# Patient Record
Sex: Male | Born: 1995 | Race: Black or African American | Hispanic: No | Marital: Single | Smoking: Current every day smoker
Health system: Southern US, Community
[De-identification: ages and names within clinical notes are randomized; demographics above are authoritative.]

## PROBLEM LIST (undated history)

## (undated) DIAGNOSIS — J45909 Unspecified asthma, uncomplicated: Secondary | ICD-10-CM

## (undated) HISTORY — PX: ICD IMPLANT: EP1208

---
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
## 2017-07-12 ENCOUNTER — Emergency Department (HOSPITAL_BASED_OUTPATIENT_CLINIC_OR_DEPARTMENT_OTHER)
Admission: EM | Admit: 2017-07-12 | Discharge: 2017-07-12 | Disposition: A | Discharge status: Home / Self Care | Payer: Medicaid Other | Attending: Physician Assistant | Admitting: Physician Assistant

## 2017-07-12 ENCOUNTER — Emergency Department (HOSPITAL_BASED_OUTPATIENT_CLINIC_OR_DEPARTMENT_OTHER): Payer: Medicaid Other

## 2017-07-12 ENCOUNTER — Encounter (HOSPITAL_BASED_OUTPATIENT_CLINIC_OR_DEPARTMENT_OTHER): Payer: Self-pay | Admitting: Emergency Medicine

## 2017-07-12 DIAGNOSIS — Y939 Activity, unspecified: Secondary | ICD-10-CM | POA: Diagnosis not present

## 2017-07-12 DIAGNOSIS — M70841 Other soft tissue disorders related to use, overuse and pressure, right hand: Secondary | ICD-10-CM | POA: Diagnosis not present

## 2017-07-12 DIAGNOSIS — J45909 Unspecified asthma, uncomplicated: Secondary | ICD-10-CM | POA: Insufficient documentation

## 2017-07-12 DIAGNOSIS — Z79899 Other long term (current) drug therapy: Secondary | ICD-10-CM | POA: Diagnosis not present

## 2017-07-12 DIAGNOSIS — F17290 Nicotine dependence, other tobacco product, uncomplicated: Secondary | ICD-10-CM | POA: Insufficient documentation

## 2017-07-12 DIAGNOSIS — M779 Enthesopathy, unspecified: Secondary | ICD-10-CM

## 2017-07-12 DIAGNOSIS — M25531 Pain in right wrist: Secondary | ICD-10-CM | POA: Diagnosis present

## 2017-07-12 HISTORY — DX: Unspecified asthma, uncomplicated: J45.909

## 2017-07-12 MED ORDER — IBUPROFEN 800 MG PO TABS: 800.0000 mg | ORAL_TABLET | Freq: Three times a day (TID) | ORAL | 0 refills | Status: DC

## 2017-07-12 NOTE — Discharge Instructions (Signed)
Use ibupofr3en and the wrist splint to help with yoru symptoms.  Return to PCP for follow up as needed.

## 2017-07-12 NOTE — ED Provider Notes (Signed)
[facility]   CSN: [id number] Arrival date & time: 07/12/17  1131     History   Chief Complaint Chief Complaint  Patient presents with  . Hand Pain    HPI Warren Cox is a 21 y.o. male.  HPI   Right wrist pain since starting a new job. 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 Patient reports that it hurts when he uses it. He reports mild swelling to the right wrist. Occasionaltingling in all 5 fingers. Mom has history of tendinitis and carpal tunnel brought him here for evaluation.  No injury. No warmth. No fevers.  Past Medical History:  Diagnosis Date  . Asthma     Patient Active Problem List   Diagnosis Date Noted  . Left shoulder pain 07/01/2016  . Right knee injury 05/23/2016  . Crushing injury of left hand 09/01/2012  . Left wrist injury 09/01/2012  . Knee pain, left 12/06/2011    History reviewed. No pertinent surgical history.     Home Medications    Prior to Admission medications   Medication Sig Start Date End Date Taking? Authorizing Provider  albuterol (PROVENTIL HFA;VENTOLIN HFA) 108 (90 BASE) MCG/ACT inhaler Inhale 2 puffs into the lungs every 6 (six) hours as needed for wheezing or shortness of breath (use before exercise).    [provider]  meloxicam (MOBIC) 15 MG tablet Take 1 tablet (15 mg total) by mouth daily. Take 1 daily with food. 12/23/16   Arthor CaptainHarris, Abigail, PA-C    Family History Family History  Problem Relation Age of Onset  . Sudden death Neg Hx   . Heart attack Neg Hx     Social History Social History  Substance Use Topics  . Smoking status: Current Every Day Smoker    Types: Cigars  . Smokeless tobacco: Never Used  . Alcohol use No     Allergies   Augmentin [amoxicillin-pot clavulanate] and Keflex [cephalexin monohydrate]   Review of Systems Review of Systems  Constitutional: Negative for activity change.  Respiratory: Negative for shortness of breath.   Cardiovascular:  Negative for chest pain.  Gastrointestinal: Negative for abdominal pain.     Physical Exam Updated Vital Signs BP 131/79 (BP Location: Left Arm)   Pulse 70   Temp 98.3 F (36.8 C) (Oral)   Resp 20   Ht 5\' 11"  (1.803 m)   Wt 96.3 kg (212 lb 6.4 oz)   SpO2 100%   BMI 29.62 kg/m   Physical Exam  Constitutional: He is oriented to person, place, and time. He appears well-nourished.  HENT:  Head: Normocephalic.  Eyes: Conjunctivae are normal.  Cardiovascular: Normal rate.   Pulmonary/Chest: Effort normal.  Musculoskeletal:  Full range motion. No warmth or swelling. Negative Phalen's. The strength in all 5 fingers. Normal range of motion in right upper extremity.  Neurological: He is oriented to person, place, and time.  Skin: Skin is warm and dry. He is not diaphoretic.  Psychiatric: He has a normal mood and affect. His behavior is normal.     ED Treatments / Results  Labs (all labs ordered are listed, but only abnormal results are displayed) Labs Reviewed - No data to display  EKG  EKG Interpretation None       Radiology No results found.  Procedures Procedures (including critical care time)  Medications Ordered in ED Medications - No data to display   Initial Impression / Assessment and Plan / ED Course  I have reviewed the  triage vital signs and the nursing notes.  Pertinent labs & imaging results that were available during my care of the patient were reviewed by me and considered in my medical decision making (see chart for details).     Right wrist pain since starting a new job. New jobDatabase administratoring boxes. Patient reports that it hurts when he uses it. He reports mild swelling to the right wrist. Occasionaltingling in all 5 fingers. Mom has history of tendinitis and carpal tunnel brought him here for evaluation.  No injury. No warmth. No fevers.  11:55 AM We'll get x-ray. Likely due to overuse injury. We'll have him use wrist splint,  ibuprofen For the next several days to follow up with primary care physician.  Final Clinical Impressions(s) / ED Diagnoses   Final diagnoses:  None    New Prescriptions New Prescriptions   No medications on file     Abelino Derrick, MD 07/12/17 1155

## 2017-07-12 NOTE — ED Triage Notes (Signed)
Right hand with swelling, tingling and pain. Denies injury but has started a new job where he is using his hands in a certain motion. Used Ibuprofen, Ice and Heat. No relief.

## 2019-08-11 ENCOUNTER — Other Ambulatory Visit: Payer: Self-pay

## 2019-08-11 ENCOUNTER — Encounter (HOSPITAL_BASED_OUTPATIENT_CLINIC_OR_DEPARTMENT_OTHER): Payer: Self-pay

## 2019-08-11 ENCOUNTER — Emergency Department (HOSPITAL_BASED_OUTPATIENT_CLINIC_OR_DEPARTMENT_OTHER): Payer: Self-pay

## 2019-08-11 ENCOUNTER — Emergency Department (HOSPITAL_BASED_OUTPATIENT_CLINIC_OR_DEPARTMENT_OTHER)
Admission: EM | Admit: 2019-08-11 | Discharge: 2019-08-11 | Disposition: A | Discharge status: Home / Self Care | Payer: Self-pay | Attending: Emergency Medicine | Admitting: Emergency Medicine

## 2019-08-11 DIAGNOSIS — J45909 Unspecified asthma, uncomplicated: Secondary | ICD-10-CM | POA: Insufficient documentation

## 2019-08-11 DIAGNOSIS — F17290 Nicotine dependence, other tobacco product, uncomplicated: Secondary | ICD-10-CM | POA: Insufficient documentation

## 2019-08-11 DIAGNOSIS — M25561 Pain in right knee: Secondary | ICD-10-CM | POA: Insufficient documentation

## 2019-08-11 MED ORDER — NAPROXEN 500 MG PO TABS: 500.0000 mg | ORAL_TABLET | Freq: Two times a day (BID) | ORAL | 0 refills | Status: AC | PRN

## 2019-08-11 MED ORDER — NAPROXEN 250 MG PO TABS
500.0000 mg | ORAL_TABLET | Freq: Once | ORAL | Status: AC
  Administered 2019-08-11: 20:00:00 500 mg via ORAL
  Filled 2019-08-11: qty 2

## 2019-08-11 NOTE — ED Provider Notes (Signed)
[facility] EMERGENCY DEPARTMENT Provider Note   CSN: [id number] Arrival date & time: 08/11/19  1857     History   Chief Complaint Chief Complaint  Patient presents with  . Knee Injury    HPI Warren Cox is a 23 y.o. male with past medical history significant for asthma presents to emergency department today with chief complaint of right knee pain.  Patient states he has a history of injury to right knee back in high school when he played basketball.  He reports that 2 weeks ago he twisted wrong and felt pain in the right knee.  It resolved without intervention.  Unfortunately tonight while he was at work he states he again twisted his right knee and is having 7 out of 10 pain.  He describes the pain as dull ache with intermittent stabbing sensation only present with movement.  He is able to walk and bear weight on his right lower extremity.  He has been taking Tylenol with minimal symptom relief.  He denies any numbness, weakness or tingling sensation.  Denies hip pain or ankle pain. History provided by patient with additional history obtained from chart review.       Past Medical History:  Diagnosis Date  . Asthma     Patient Active Problem List   Diagnosis Date Noted  . Left shoulder pain 07/01/2016  . Right knee injury 05/23/2016  . Crushing injury of left hand 09/01/2012  . Left wrist injury 09/01/2012  . Knee pain, left 12/06/2011    History reviewed. No pertinent surgical history.      Home Medications    Prior to Admission medications   Medication Sig Start Date End Date Taking? Authorizing Provider  albuterol (PROVENTIL HFA;VENTOLIN HFA) 108 (90 BASE) MCG/ACT inhaler Inhale 2 puffs into the lungs every 6 (six) hours as needed for wheezing or shortness of breath (use before exercise).    [provider]  naproxen (NAPROSYN) 500 MG tablet Take 1 tablet (500 mg total) by mouth 2 (two) times daily as needed for up to 14 days. 08/11/19 08/25/19   Kenlie Seki, Caroleen Hamman, PA-C    Family History Family History  Problem Relation Age of Onset  . Sudden death Neg Hx   . Heart attack Neg Hx     Social History Social History   Tobacco Use  . Smoking status: Current Every Day Smoker    Types: Cigars  . Smokeless tobacco: Never Used  Substance Use Topics  . Alcohol use: Yes    Alcohol/week: 0.0 standard drinks    Frequency: Never    Comment: occ  . Drug use: No     Allergies   Augmentin [amoxicillin-pot clavulanate] and Keflex [cephalexin monohydrate]   Review of Systems Review of Systems  Constitutional: Negative for chills and fever.  Gastrointestinal: Negative for nausea and vomiting.  Musculoskeletal: Positive for arthralgias. Negative for back pain, gait problem, joint swelling, myalgias and neck pain.  Skin: Negative for rash and wound.  Neurological: Negative for weakness and numbness.     Physical Exam Updated Vital Signs BP 120/69 (BP Location: Left Arm)   Pulse 67   Temp 98.2 F (36.8 C) (Oral)   Resp 18   Ht 5\' 11"  (1.803 m)   Wt 89.4 kg   SpO2 99%   BMI 27.48 kg/m   Physical Exam Vitals signs and nursing note reviewed.  Constitutional:      Appearance: He is well-developed. He is not ill-appearing or toxic-appearing.  HENT:  Head: Normocephalic and atraumatic.     Nose: Nose normal.  Eyes:     General: No scleral icterus.       Right eye: No discharge.        Left eye: No discharge.     Conjunctiva/sclera: Conjunctivae normal.  Neck:     Musculoskeletal: Normal range of motion.     Vascular: No JVD.  Cardiovascular:     Rate and Rhythm: Normal rate and regular rhythm.     Pulses: Normal pulses.          Dorsalis pedis pulses are 2+ on the right side and 2+ on the left side.     Heart sounds: Normal heart sounds.  Pulmonary:     Effort: Pulmonary effort is normal.     Breath sounds: Normal breath sounds.  Abdominal:     General: There is no distension.  Musculoskeletal: Normal  range of motion.     Right hip: Normal.     Right knee: He exhibits swelling, bony tenderness and MCL laxity. He exhibits normal range of motion, no effusion, no ecchymosis, no deformity, no laceration, no erythema, normal alignment, no LCL laxity, normal patellar mobility and normal meniscus. No tenderness found. No medial joint line and no lateral joint line tenderness noted.     Right ankle: Normal.     Right lower leg: No edema.     Left lower leg: No edema.     Comments: Pt ambulates with steady gait.  Negative anterior and posterior drawer test to right knee.Neurovascularly intact distally. Compartments soft above and below affected joint.    Skin:    General: Skin is warm and dry.  Neurological:     Mental Status: He is oriented to person, place, and time.     GCS: GCS eye subscore is 4. GCS verbal subscore is 5. GCS motor subscore is 6.     Comments: Fluent speech, no facial droop.  Psychiatric:        Behavior: Behavior normal.      ED Treatments / Results  Labs (all labs ordered are listed, but only abnormal results are displayed) Labs Reviewed - No data to display  EKG None  Radiology Dg Knee Complete 4 Views Right  Result Date: 08/11/2019 CLINICAL DATA:  Recent injury with right knee pain, initial encounter EXAM: RIGHT KNEE - COMPLETE 4+ VIEW COMPARISON:  None. FINDINGS: No evidence of fracture, dislocation, or joint effusion. No evidence of arthropathy or other focal bone abnormality. Soft tissues are unremarkable. IMPRESSION: No acute abnormality noted. Electronically Signed   By: Inez Catalina M.D.   On: 08/11/2019 19:37    Procedures Procedures (including critical care time)  Medications Ordered in ED Medications  naproxen (NAPROSYN) tablet 500 mg (has no administration in time range)     Initial Impression / Assessment and Plan / ED Course  I have reviewed the triage vital signs and the nursing notes.  Pertinent labs & imaging results that were available  during my care of the patient were reviewed by me and considered in my medical decision making (see chart for details).  Patient presents to the ED with complaints of pain to the right knee pain s/p twisting it wrong at work.. Exam without obvious deformity or open wounds. ROM intact.  Bony tenderness to palpation.  NVI distally. Xray negative for fracture/dislocation. Therapeutic splint provided.  He is able to bear weight on right leg and ambulates with steady gait.Marland Kitchen PRICE and naproxen recommended. I  discussed results, treatment plan, need for follow-up with sports medicine, and return precautions with the patient. Provided opportunity for questions, patient confirmed understanding and are in agreement with plan.   Portions of this note were generated with Scientist, clinical (histocompatibility and immunogenetics)Dragon dictation software. Dictation errors may occur despite best attempts at proofreading.   Final Clinical Impressions(s) / ED Diagnoses   Final diagnoses:  Acute pain of right knee    ED Discharge Orders         Ordered    naproxen (NAPROSYN) 500 MG tablet  2 times daily PRN     08/11/19 2008           Kathyrn Lasslbrizze, Chimaobi Casebolt E, PA-C 08/11/19 2021    Tegeler, Canary Brimhristopher J, MD 08/11/19 [phone]

## 2019-08-11 NOTE — Discharge Instructions (Addendum)
You were seen today for pain in your right knee. Please read and follow all provided instructions.   The x-ray of your knee today did not show signs of fracture or dislocation.  1. Medications: alternate naprosyn and tylenol for pain control, usual home medications.  You should eat when taking Naprosyn as it can cause her stomach to become upset  2. Treatment: rest, ice, elevate and use brace, drink plenty of fluids, gentle stretching  3. Follow Up: Please followup with the sports medicine Dr. Raeford Razor as listed above.  Call his office tomorrow to schedule follow-up appointment.  Please return to the ER for worsening symptoms or other concerns

## 2019-08-11 NOTE — ED Triage Notes (Signed)
Pt c/o injury to right knee x 2 in the last 1-2 weeks-NAD-steady gait

## 2019-09-29 ENCOUNTER — Emergency Department (HOSPITAL_BASED_OUTPATIENT_CLINIC_OR_DEPARTMENT_OTHER): Payer: Self-pay

## 2019-09-29 ENCOUNTER — Emergency Department (HOSPITAL_BASED_OUTPATIENT_CLINIC_OR_DEPARTMENT_OTHER)
Admission: EM | Admit: 2019-09-29 | Discharge: 2019-09-29 | Disposition: A | Discharge status: Home / Self Care | Payer: Self-pay | Attending: Emergency Medicine | Admitting: Emergency Medicine

## 2019-09-29 ENCOUNTER — Encounter (HOSPITAL_BASED_OUTPATIENT_CLINIC_OR_DEPARTMENT_OTHER): Payer: Self-pay

## 2019-09-29 ENCOUNTER — Other Ambulatory Visit: Payer: Self-pay

## 2019-09-29 DIAGNOSIS — S63502A Unspecified sprain of left wrist, initial encounter: Secondary | ICD-10-CM | POA: Insufficient documentation

## 2019-09-29 DIAGNOSIS — Y929 Unspecified place or not applicable: Secondary | ICD-10-CM | POA: Insufficient documentation

## 2019-09-29 DIAGNOSIS — X500XXA Overexertion from strenuous movement or load, initial encounter: Secondary | ICD-10-CM | POA: Insufficient documentation

## 2019-09-29 DIAGNOSIS — Y9389 Activity, other specified: Secondary | ICD-10-CM | POA: Insufficient documentation

## 2019-09-29 DIAGNOSIS — Z79899 Other long term (current) drug therapy: Secondary | ICD-10-CM | POA: Insufficient documentation

## 2019-09-29 DIAGNOSIS — F17290 Nicotine dependence, other tobacco product, uncomplicated: Secondary | ICD-10-CM | POA: Insufficient documentation

## 2019-09-29 DIAGNOSIS — Y999 Unspecified external cause status: Secondary | ICD-10-CM | POA: Insufficient documentation

## 2019-09-29 MED ORDER — IBUPROFEN 600 MG PO TABS: 600.0000 mg | ORAL_TABLET | Freq: Four times a day (QID) | ORAL | 0 refills | Status: AC | PRN

## 2019-09-29 MED ORDER — ACETAMINOPHEN 500 MG PO TABS: 1000.0000 mg | ORAL_TABLET | Freq: Three times a day (TID) | ORAL | 0 refills | Status: AC | PRN

## 2019-09-29 MED ORDER — IBUPROFEN 200 MG PO TABS
600.0000 mg | ORAL_TABLET | Freq: Once | ORAL | Status: AC
  Administered 2019-09-29: 11:00:00 600 mg via ORAL
  Filled 2019-09-29: qty 1

## 2019-09-29 NOTE — ED Provider Notes (Signed)
[facility] EMERGENCY DEPARTMENT Provider Note   CSN: [id number] Arrival date & time: 09/29/19  1015     History   Chief Complaint Chief Complaint  Patient presents with  . Wrist Pain    HPI Warren Cox is a 23 y.o. male with history of asthma who presents with left wrist pain after lifting a couch.  Patient reports she was helping a friend move and he suddenly felt pain in his left wrist.  He did not feel anything pop.  He denies any numbness or tingling.  He has had decreased range of motion due to pain since.  He has been using Tylenol, ice, heat without relief.  He denies any other injuries.     HPI  Past Medical History:  Diagnosis Date  . Asthma     Patient Active Problem List   Diagnosis Date Noted  . Left shoulder pain 07/01/2016  . Right knee injury 05/23/2016  . Crushing injury of left hand 09/01/2012  . Left wrist injury 09/01/2012  . Knee pain, left 12/06/2011    History reviewed. No pertinent surgical history.      Home Medications    Prior to Admission medications   Medication Sig Start Date End Date Taking? Authorizing Provider  acetaminophen (TYLENOL) 500 MG tablet Take 2 tablets (1,000 mg total) by mouth every 8 (eight) hours as needed for moderate pain. 09/29/19   Meeyah Ovitt, Waylan Boga, PA-C  albuterol (PROVENTIL HFA;VENTOLIN HFA) 108 (90 BASE) MCG/ACT inhaler Inhale 2 puffs into the lungs every 6 (six) hours as needed for wheezing or shortness of breath (use before exercise).    [provider]  ibuprofen (ADVIL) 600 MG tablet Take 1 tablet (600 mg total) by mouth every 6 (six) hours as needed. 09/29/19   Emi Holes, PA-C    Family History Family History  Problem Relation Age of Onset  . Sudden death Neg Hx   . Heart attack Neg Hx     Social History Social History   Tobacco Use  . Smoking status: Current Every Day Smoker    Types: Cigars  . Smokeless tobacco: Never Used  Substance Use Topics  . Alcohol use:  Yes    Alcohol/week: 0.0 standard drinks    Frequency: Never    Comment: occ  . Drug use: No     Allergies   Augmentin [amoxicillin-pot clavulanate] and Keflex [cephalexin monohydrate]   Review of Systems Review of Systems  Musculoskeletal: Positive for arthralgias and joint swelling.  Neurological: Negative for numbness.     Physical Exam Updated Vital Signs BP (!) 143/73 (BP Location: Right Arm)   Pulse 68   Temp 98.5 F (36.9 C) (Oral)   Resp 18   Ht 5\' 11"  (1.803 m)   Wt 88.5 kg   SpO2 100%   BMI 27.20 kg/m   Physical Exam Vitals signs and nursing note reviewed.  Constitutional:      General: He is not in acute distress.    Appearance: He is well-developed. He is not diaphoretic.  HENT:     Head: Normocephalic and atraumatic.     Mouth/Throat:     Pharynx: No oropharyngeal exudate.  Eyes:     General: No scleral icterus.       Right eye: No discharge.        Left eye: No discharge.     Conjunctiva/sclera: Conjunctivae normal.     Pupils: Pupils are equal, round, and reactive to light.  Neck:  Musculoskeletal: Normal range of motion and neck supple.     Thyroid: No thyromegaly.  Cardiovascular:     Rate and Rhythm: Normal rate and regular rhythm.     Heart sounds: Normal heart sounds. No murmur. No friction rub. No gallop.   Pulmonary:     Effort: Pulmonary effort is normal. No respiratory distress.     Breath sounds: Normal breath sounds. No stridor. No wheezing or rales.  Musculoskeletal:     Comments: Left wrist tenderness on the radial aspect extending over the carpal bones; no anatomical snuffbox tenderness; pain was significant with wrist extension and deviation; radial pulses intact, sensation intact; mild swelling noted, no deformity  Lymphadenopathy:     Cervical: No cervical adenopathy.  Skin:    General: Skin is warm and dry.     Coloration: Skin is not pale.     Findings: No rash.  Neurological:     Mental Status: He is alert.      Coordination: Coordination normal.      ED Treatments / Results  Labs (all labs ordered are listed, but only abnormal results are displayed) Labs Reviewed - No data to display  EKG None  Radiology Dg Wrist Complete Left  Result Date: 09/29/2019 CLINICAL DATA:  Left wrist pain after lifting couch EXAM: LEFT WRIST - COMPLETE 3+ VIEW COMPARISON:  None. FINDINGS: Alignment is anatomic. There is no acute fracture. Joint spaces are preserved. No intrinsic osseous lesion. IMPRESSION: No acute fracture or malalignment. Electronically Signed   By: Macy Mis M.D.   On: 09/29/2019 10:41    Procedures Procedures (including critical care time)  Medications Ordered in ED Medications  ibuprofen (ADVIL) tablet 600 mg (600 mg Oral Given 09/29/19 1035)     Initial Impression / Assessment and Plan / ED Course  I have reviewed the triage vital signs and the nursing notes.  Pertinent labs & imaging results that were available during my care of the patient were reviewed by me and considered in my medical decision making (see chart for details).        Patient with left wrist pain after lifting a couch.  X-ray is negative.  There is no anatomical snuffbox tenderness.  Suspect sprain.  Supportive treatment discussed including rest, ice, brace, NSAIDs, Tylenol.  Follow-up to sports medicine if not improving.  Return precautions discussed.  Patient understands and agrees with plan.  Patient vitals stable and discharged in satisfactory condition.  Final Clinical Impressions(s) / ED Diagnoses   Final diagnoses:  Sprain of left wrist, initial encounter    ED Discharge Orders         Ordered    ibuprofen (ADVIL) 600 MG tablet  Every 6 hours PRN     09/29/19 1054    acetaminophen (TYLENOL) 500 MG tablet  Every 8 hours PRN     09/29/19 1054           Frederica Kuster, [place] 09/29/19 1057    Hayden Rasmussen, MD 09/29/19 1821

## 2019-09-29 NOTE — ED Triage Notes (Signed)
Pt reports that he was helping his friend move yesterday and developed instant pain to his right wrist. Pt is guarding in triage. Pt took tylenol around midnight.

## 2019-09-29 NOTE — ED Notes (Signed)
ED Provider at bedside. 

## 2019-09-29 NOTE — Discharge Instructions (Signed)
I suspect you sprained your wrist.  Use ice 3-4 times daily alternating 20 minutes on, 20 minutes off.  Wear the splint at all times except when bathing, especially when you are using it.  Take ibuprofen and Tylenol as prescribed for your pain.  Please follow-up with Dr. Raeford Razor with sports medicine if symptoms are not getting better.  Please return to the emergency department if you develop any new or worsening symptoms

## 2019-12-17 ENCOUNTER — Encounter (HOSPITAL_BASED_OUTPATIENT_CLINIC_OR_DEPARTMENT_OTHER): Payer: Self-pay

## 2019-12-17 ENCOUNTER — Emergency Department (HOSPITAL_BASED_OUTPATIENT_CLINIC_OR_DEPARTMENT_OTHER): Payer: Self-pay

## 2019-12-17 ENCOUNTER — Emergency Department (HOSPITAL_BASED_OUTPATIENT_CLINIC_OR_DEPARTMENT_OTHER)
Admission: EM | Admit: 2019-12-17 | Discharge: 2019-12-18 | Disposition: A | Discharge status: Home / Self Care | Payer: Self-pay | Attending: Emergency Medicine | Admitting: Emergency Medicine

## 2019-12-17 ENCOUNTER — Other Ambulatory Visit: Payer: Self-pay

## 2019-12-17 DIAGNOSIS — R0789 Other chest pain: Secondary | ICD-10-CM | POA: Insufficient documentation

## 2019-12-17 DIAGNOSIS — Z880 Allergy status to penicillin: Secondary | ICD-10-CM | POA: Insufficient documentation

## 2019-12-17 DIAGNOSIS — R509 Fever, unspecified: Secondary | ICD-10-CM

## 2019-12-17 DIAGNOSIS — Z881 Allergy status to other antibiotic agents status: Secondary | ICD-10-CM | POA: Insufficient documentation

## 2019-12-17 DIAGNOSIS — J45909 Unspecified asthma, uncomplicated: Secondary | ICD-10-CM | POA: Insufficient documentation

## 2019-12-17 DIAGNOSIS — R0602 Shortness of breath: Secondary | ICD-10-CM | POA: Insufficient documentation

## 2019-12-17 DIAGNOSIS — N3000 Acute cystitis without hematuria: Secondary | ICD-10-CM | POA: Insufficient documentation

## 2019-12-17 DIAGNOSIS — Z79899 Other long term (current) drug therapy: Secondary | ICD-10-CM | POA: Insufficient documentation

## 2019-12-17 DIAGNOSIS — N390 Urinary tract infection, site not specified: Secondary | ICD-10-CM

## 2019-12-17 DIAGNOSIS — D72829 Elevated white blood cell count, unspecified: Secondary | ICD-10-CM | POA: Insufficient documentation

## 2019-12-17 DIAGNOSIS — Z20822 Contact with and (suspected) exposure to covid-19: Secondary | ICD-10-CM | POA: Insufficient documentation

## 2019-12-17 DIAGNOSIS — F17210 Nicotine dependence, cigarettes, uncomplicated: Secondary | ICD-10-CM | POA: Insufficient documentation

## 2019-12-17 LAB — URINALYSIS, ROUTINE W REFLEX MICROSCOPIC
Bilirubin Urine: NEGATIVE
Glucose, UA: NEGATIVE mg/dL
Hgb urine dipstick: NEGATIVE
Ketones, ur: 15 mg/dL — AB
Nitrite: POSITIVE — AB
Protein, ur: NEGATIVE mg/dL
Specific Gravity, Urine: 1.01 (ref 1.005–1.030)
pH: 6 (ref 5.0–8.0)

## 2019-12-17 LAB — COMPREHENSIVE METABOLIC PANEL
ALT: 15 U/L (ref 0–44)
AST: 20 U/L (ref 15–41)
Albumin: 4.6 g/dL (ref 3.5–5.0)
Alkaline Phosphatase: 36 U/L — ABNORMAL LOW (ref 38–126)
Anion gap: 11 (ref 5–15)
BUN: 10 mg/dL (ref 6–20)
CO2: 23 mmol/L (ref 22–32)
Calcium: 9.4 mg/dL (ref 8.9–10.3)
Chloride: 100 mmol/L (ref 98–111)
Creatinine, Ser: 1.02 mg/dL (ref 0.61–1.24)
GFR calc Af Amer: >60 mL/min (ref >60)
GFR calc non Af Amer: >60 mL/min (ref >60)
Glucose, Bld: 95 mg/dL (ref 70–99)
Potassium: 3.1 mmol/L — ABNORMAL LOW (ref 3.5–5.1)
Sodium: 134 mmol/L — ABNORMAL LOW (ref 135–145)
Total Bilirubin: 2.1 mg/dL — ABNORMAL HIGH (ref 0.3–1.2)
Total Protein: 8.4 g/dL — ABNORMAL HIGH (ref 6.5–8.1)

## 2019-12-17 LAB — CBC WITH DIFFERENTIAL/PLATELET
Abs Immature Granulocytes: 0.09 10*3/uL — ABNORMAL HIGH (ref 0.00–0.07)
Basophils Absolute: 0 10*3/uL (ref 0.0–0.1)
Basophils Relative: 0 %
Eosinophils Absolute: 0 10*3/uL (ref 0.0–0.5)
Eosinophils Relative: 0 %
HCT: 42.2 % (ref 39.0–52.0)
Hemoglobin: 14.1 g/dL (ref 13.0–17.0)
Immature Granulocytes: 1 %
Lymphocytes Relative: 13 %
Lymphs Abs: 2.5 10*3/uL (ref 0.7–4.0)
MCH: 29.2 pg (ref 26.0–34.0)
MCHC: 33.4 g/dL (ref 30.0–36.0)
MCV: 87.4 fL (ref 80.0–100.0)
Monocytes Absolute: 1.4 10*3/uL — ABNORMAL HIGH (ref 0.1–1.0)
Monocytes Relative: 7 %
Neutro Abs: 15 10*3/uL — ABNORMAL HIGH (ref 1.7–7.7)
Neutrophils Relative %: 79 %
Platelets: 272 10*3/uL (ref 150–400)
RBC: 4.83 MIL/uL (ref 4.22–5.81)
RDW: 12.2 % (ref 11.5–15.5)
WBC: 19 10*3/uL — ABNORMAL HIGH (ref 4.0–10.5)
nRBC: 0 % (ref 0.0–0.2)

## 2019-12-17 LAB — GROUP A STREP BY PCR: Group A Strep by PCR: NOT DETECTED

## 2019-12-17 LAB — URINALYSIS, MICROSCOPIC (REFLEX): Squamous Epithelial / HPF: NONE SEEN (ref 0–5)

## 2019-12-17 LAB — SARS CORONAVIRUS 2 AG (30 MIN TAT): SARS Coronavirus 2 Ag: NEGATIVE

## 2019-12-17 LAB — LACTIC ACID, PLASMA: Lactic Acid, Venous: 0.7 mmol/L (ref 0.5–1.9)

## 2019-12-17 MED ORDER — ACETAMINOPHEN 325 MG PO TABS
650.0000 mg | ORAL_TABLET | Freq: Once | ORAL | Status: AC | PRN
  Administered 2019-12-17: 650 mg via ORAL
  Filled 2019-12-17: qty 2

## 2019-12-17 MED ORDER — IOHEXOL 300 MG/ML  SOLN
100.0000 mL | Freq: Once | INTRAMUSCULAR | Status: AC | PRN
  Administered 2019-12-17: 100 mL via INTRAVENOUS

## 2019-12-17 MED ORDER — SODIUM CHLORIDE 0.9 % IV BOLUS
1000.0000 mL | Freq: Once | INTRAVENOUS | Status: AC
  Administered 2019-12-17: 22:00:00 1000 mL via INTRAVENOUS

## 2019-12-17 MED ORDER — AZITHROMYCIN 1 G PO PACK
2.0000 g | PACK | Freq: Once | ORAL | Status: AC
  Administered 2019-12-18: 2 g via ORAL
  Filled 2019-12-17: qty 2

## 2019-12-17 MED ORDER — CIPROFLOXACIN HCL 500 MG PO TABS
500.0000 mg | ORAL_TABLET | Freq: Once | ORAL | Status: AC
  Administered 2019-12-18: 500 mg via ORAL
  Filled 2019-12-17: qty 1

## 2019-12-17 MED ORDER — CIPROFLOXACIN HCL 500 MG PO TABS: 500.0000 mg | ORAL_TABLET | Freq: Two times a day (BID) | ORAL | 0 refills | Status: AC

## 2019-12-17 NOTE — ED Triage Notes (Signed)
Pt c/o fever, n/v, SOB started last night-last tylenol "this morning"-NAD-steady gait

## 2019-12-17 NOTE — ED Provider Notes (Addendum)
Nursing notes and vitals signs, including pulse oximetry, reviewed.  Summary of this visit's results, reviewed by myself:  EKG:  EKG Interpretation  Date/Time:    Ventricular Rate:    PR Interval:    QRS Duration:   QT Interval:    QTC Calculation:   R Axis:     Text Interpretation:         Labs:  Results for orders placed or performed during the hospital encounter of 12/17/19 (from the past 24 hour(s))  Comprehensive metabolic panel     Status: Abnormal   Collection Time: 12/17/19  7:49 PM  Result Value Ref Range   Sodium 134 (L) 135 - 145 mmol/L   Potassium 3.1 (L) 3.5 - 5.1 mmol/L   Chloride 100 98 - 111 mmol/L   CO2 23 22 - 32 mmol/L   Glucose, Bld 95 70 - 99 mg/dL   BUN 10 6 - 20 mg/dL   Creatinine, Ser 1.02 0.61 - 1.24 mg/dL   Calcium 9.4 8.9 - 10.3 mg/dL   Total Protein 8.4 (H) 6.5 - 8.1 g/dL   Albumin 4.6 3.5 - 5.0 g/dL   AST 20 15 - 41 U/L   ALT 15 0 - 44 U/L   Alkaline Phosphatase 36 (L) 38 - 126 U/L   Total Bilirubin 2.1 (H) 0.3 - 1.2 mg/dL   GFR calc non Af Amer >60 >60 mL/min   GFR calc Af Amer >60 >60 mL/min   Anion gap 11 5 - 15  CBC with Differential     Status: Abnormal   Collection Time: 12/17/19  7:49 PM  Result Value Ref Range   WBC 19.0 (H) 4.0 - 10.5 K/uL   RBC 4.83 4.22 - 5.81 MIL/uL   Hemoglobin 14.1 13.0 - 17.0 g/dL   HCT 42.2 39.0 - 52.0 %   MCV 87.4 80.0 - 100.0 fL   MCH 29.2 26.0 - 34.0 pg   MCHC 33.4 30.0 - 36.0 g/dL   RDW 12.2 11.5 - 15.5 %   Platelets 272 150 - 400 K/uL   nRBC 0.0 0.0 - 0.2 %   Neutrophils Relative % 79 %   Neutro Abs 15.0 (H) 1.7 - 7.7 K/uL   Lymphocytes Relative 13 %   Lymphs Abs 2.5 0.7 - 4.0 K/uL   Monocytes Relative 7 %   Monocytes Absolute 1.4 (H) 0.1 - 1.0 K/uL   Eosinophils Relative 0 %   Eosinophils Absolute 0.0 0.0 - 0.5 K/uL   Basophils Relative 0 %   Basophils Absolute 0.0 0.0 - 0.1 K/uL   Immature Granulocytes 1 %   Abs Immature Granulocytes 0.09 (H) 0.00 - 0.07 K/uL  SARS Coronavirus 2 Ag  (30 min TAT) - Nasal Swab (BD Veritor Kit)     Status: None   Collection Time: 12/17/19  8:52 PM   Specimen: Nasal Swab (BD Veritor Kit)  Result Value Ref Range   SARS Coronavirus 2 Ag NEGATIVE NEGATIVE  Group A Strep by PCR     Status: None   Collection Time: 12/17/19  9:30 PM   Specimen: Throat; Sterile Swab  Result Value Ref Range   Group A Strep by PCR NOT DETECTED NOT DETECTED  Lactic acid, plasma     Status: None   Collection Time: 12/17/19  9:50 PM  Result Value Ref Range   Lactic Acid, Venous 0.7 0.5 - 1.9 mmol/L  Urinalysis, Routine w reflex microscopic     Status: Abnormal   Collection Time: 12/17/19 10:30  PM  Result Value Ref Range   Color, Urine YELLOW YELLOW   APPearance HAZY (A) CLEAR   Specific Gravity, Urine 1.010 1.005 - 1.030   pH 6.0 5.0 - 8.0   Glucose, UA NEGATIVE NEGATIVE mg/dL   Hgb urine dipstick NEGATIVE NEGATIVE   Bilirubin Urine NEGATIVE NEGATIVE   Ketones, ur 15 (A) NEGATIVE mg/dL   Protein, ur NEGATIVE NEGATIVE mg/dL   Nitrite POSITIVE (A) NEGATIVE   Leukocytes,Ua SMALL (A) NEGATIVE  Rapid urine drug screen (hospital performed)     Status: Abnormal   Collection Time: 12/17/19 10:30 PM  Result Value Ref Range   Opiates NONE DETECTED NONE DETECTED   Cocaine NONE DETECTED NONE DETECTED   Benzodiazepines NONE DETECTED NONE DETECTED   Amphetamines NONE DETECTED NONE DETECTED   Tetrahydrocannabinol POSITIVE (A) NONE DETECTED   Barbiturates NONE DETECTED NONE DETECTED  Urinalysis, Microscopic (reflex)     Status: Abnormal   Collection Time: 12/17/19 10:30 PM  Result Value Ref Range   RBC / HPF 0-5 0 - 5 RBC/hpf   WBC, UA 21-50 0 - 5 WBC/hpf   Bacteria, UA MANY (A) NONE SEEN   Squamous Epithelial / LPF NONE SEEN 0 - 5    Imaging Studies: CT Chest W Contrast  Result Date: 12/17/2019 CLINICAL DATA:  Abdominal pain, fever. Patient reports rib pain. Shortness of breath. EXAM: CT CHEST, ABDOMEN, AND PELVIS WITH CONTRAST TECHNIQUE: Multidetector CT  imaging of the chest, abdomen and pelvis was performed following the standard protocol during bolus administration of intravenous contrast. CONTRAST:  11m OMNIPAQUE IOHEXOL 300 MG/ML  SOLN COMPARISON:  Chest radiograph earlier this day. FINDINGS: CT CHEST FINDINGS Cardiovascular: Thoracic aorta is normal in caliber. Examination not tailored for pulmonary artery evaluation, no gross central pulmonary embolus. Heart is normal in size. No pericardial effusion. Mediastinum/Nodes: No enlarged mediastinal or hilar lymph nodes. Minimal triangular soft tissue density in the anterior mediastinum most consistent with residual thymus, normal for age. No thyroid nodule. Unremarkable esophagus. Lungs/Pleura: The lungs are clear. No focal airspace disease. No pleural fluid or pulmonary edema. No pulmonary mass. Musculoskeletal: There are no acute or suspicious osseous abnormalities. No focal rib abnormality to explain rib pain. No chest wall soft tissue abnormality. CT ABDOMEN PELVIS FINDINGS Hepatobiliary: No focal liver abnormality is seen. No gallstones, gallbladder wall thickening, or biliary dilatation. Pancreas: No ductal dilatation or inflammation. Spleen: Normal in size without focal abnormality. Adrenals/Urinary Tract: Normal adrenal glands. No hydronephrosis or perinephric edema. Homogeneous renal enhancement. Urinary bladder is partially distended without wall thickening. Stomach/Bowel: Stomach is within normal limits. Appendix appears normal. No evidence of bowel wall thickening, distention, or inflammatory changes. Vascular/Lymphatic: The abdominal aorta is normal in caliber. Portal vein is patent. No enlarged lymph nodes in the abdomen or pelvis. Reproductive: Prostate is unremarkable. Other: No free air, free fluid, or intra-abdominal fluid collection. Musculoskeletal: Multiple bone islands in the pelvis. There are no acute or suspicious osseous abnormalities. IMPRESSION: Unremarkable CT of the chest, abdomen,  and pelvis. No acute findings or explanation for pain and fever. Electronically Signed   By: MKeith RakeM.D.   On: 12/17/2019 23:28   CT Abdomen Pelvis W Contrast  Result Date: 12/17/2019 CLINICAL DATA:  Abdominal pain, fever. Patient reports rib pain. Shortness of breath. EXAM: CT CHEST, ABDOMEN, AND PELVIS WITH CONTRAST TECHNIQUE: Multidetector CT imaging of the chest, abdomen and pelvis was performed following the standard protocol during bolus administration of intravenous contrast. CONTRAST:  1061mOMNIPAQUE IOHEXOL 300 MG/ML  SOLN COMPARISON:  Chest radiograph earlier this day. FINDINGS: CT CHEST FINDINGS Cardiovascular: Thoracic aorta is normal in caliber. Examination not tailored for pulmonary artery evaluation, no gross central pulmonary embolus. Heart is normal in size. No pericardial effusion. Mediastinum/Nodes: No enlarged mediastinal or hilar lymph nodes. Minimal triangular soft tissue density in the anterior mediastinum most consistent with residual thymus, normal for age. No thyroid nodule. Unremarkable esophagus. Lungs/Pleura: The lungs are clear. No focal airspace disease. No pleural fluid or pulmonary edema. No pulmonary mass. Musculoskeletal: There are no acute or suspicious osseous abnormalities. No focal rib abnormality to explain rib pain. No chest wall soft tissue abnormality. CT ABDOMEN PELVIS FINDINGS Hepatobiliary: No focal liver abnormality is seen. No gallstones, gallbladder wall thickening, or biliary dilatation. Pancreas: No ductal dilatation or inflammation. Spleen: Normal in size without focal abnormality. Adrenals/Urinary Tract: Normal adrenal glands. No hydronephrosis or perinephric edema. Homogeneous renal enhancement. Urinary bladder is partially distended without wall thickening. Stomach/Bowel: Stomach is within normal limits. Appendix appears normal. No evidence of bowel wall thickening, distention, or inflammatory changes. Vascular/Lymphatic: The abdominal aorta is  normal in caliber. Portal vein is patent. No enlarged lymph nodes in the abdomen or pelvis. Reproductive: Prostate is unremarkable. Other: No free air, free fluid, or intra-abdominal fluid collection. Musculoskeletal: Multiple bone islands in the pelvis. There are no acute or suspicious osseous abnormalities. IMPRESSION: Unremarkable CT of the chest, abdomen, and pelvis. No acute findings or explanation for pain and fever. Electronically Signed   By: Keith Rake M.D.   On: 12/17/2019 23:28   DG Chest Port 1 View  Result Date: 12/17/2019 CLINICAL DATA:  Rib pain and fever. Shortness of breath. EXAM: PORTABLE CHEST 1 VIEW COMPARISON:  Radiograph 11/18/2011 FINDINGS: The cardiomediastinal contours are normal. The lungs are clear. Pulmonary vasculature is normal. No consolidation, pleural effusion, or pneumothorax. No acute osseous abnormalities are seen. IMPRESSION: No acute chest findings.  Negative portable AP view of the chest. Electronically Signed   By: Keith Rake M.D.   On: 12/17/2019 20:14   11:44 PM Patient localizes his pain to the right lower rib area.  On my exam he does not have right CVA tenderness and his CT scans are unremarkable.  His urinalysis does show evidence of urinary tract infection.  This could represent Neisseria gonorrheae but the leukocytosis and systemic fever would suggest a disseminated infection which is unusual especially in the absence of urethral discharge or dysuria.  It is unlikely to be Chlamydia trachomatis as this is an intracellular organism and would not expect to show free bacteria on microscopic examination.  Urine has been sent for gonorrhea and Chlamydia testing.  In the meantime we will treat for gonorrhea with Zithromax 2 g (patient has cephalosporin allergy) and for possible early pyelonephritis with Cipro for 1 week.  COVID-19 is unlikely given his leukocytosis and pyuria/bacteriuria.  [place] Wadley was evaluated in Emergency Department on  12/18/2019 for the symptoms described in the history of present illness. He was evaluated in the context of the global COVID-19 pandemic, which necessitated consideration that the patient might be at risk for infection with the SARS-CoV-2 virus that causes COVID-19. Institutional protocols and algorithms that pertain to the evaluation of patients at risk for COVID-19 are in a state of rapid change based on information released by regulatory bodies including the CDC and federal and state organizations. These policies and algorithms were followed during the patient's care in the ED.     Shanon Rosser, MD 12/17/19 2352  Shanon Rosser, MD 12/17/19 2353    Shanon Rosser, MD 12/17/19 2355    Shanon Rosser, MD 12/18/19 [phone]

## 2019-12-17 NOTE — ED Provider Notes (Signed)
[facility] EMERGENCY DEPARTMENT Provider Note   CSN: [id number] Arrival date & time: 12/17/19  1926     History Chief Complaint  Patient presents with  . Fever    Warren Cox is a 24 y.o. male.  24 year old male with past medical history of asthma presents with complaint of fever and rib pain x3 days.  Patient has been taking Tylenol at home for his fever, last had Tylenol this morning.  No known sick contacts however works at [employer].  Patient denies nausea, vomiting, abdominal pain, changes in bowel or bladder habits, generalized body aches, sore throat, cough, shortness of breath.  No other complaints or concerns.  Warren Cox was evaluated in Emergency Department on 12/17/2019 for the symptoms described in the history of present illness. He was evaluated in the context of the global COVID-19 pandemic, which necessitated consideration that the patient might be at risk for infection with the SARS-CoV-2 virus that causes COVID-19. Institutional protocols and algorithms that pertain to the evaluation of patients at risk for COVID-19 are in a state of rapid change based on information released by regulatory bodies including the CDC and federal and state organizations. These policies and algorithms were followed during the patient's care in the ED.         Past Medical History:  Diagnosis Date  . Asthma     Patient Active Problem List   Diagnosis Date Noted  . Left shoulder pain 07/01/2016  . Right knee injury 05/23/2016  . Crushing injury of left hand 09/01/2012  . Left wrist injury 09/01/2012  . Knee pain, left 12/06/2011    History reviewed. No pertinent surgical history.     Family History  Problem Relation Age of Onset  . Sudden death Neg Hx   . Heart attack Neg Hx     Social History   Tobacco Use  . Smoking status: Current Every Day Smoker    Types: Cigars  . Smokeless tobacco: Never Used  Substance Use Topics  . Alcohol use: Yes   Alcohol/week: 0.0 standard drinks    Comment: occ  . Drug use: No    Home Medications Prior to Admission medications   Medication Sig Start Date End Date Taking? Authorizing Provider  acetaminophen (TYLENOL) 500 MG tablet Take 2 tablets (1,000 mg total) by mouth every 8 (eight) hours as needed for moderate pain. 09/29/19   Law, Waylan Boga, PA-C  albuterol (PROVENTIL HFA;VENTOLIN HFA) 108 (90 BASE) MCG/ACT inhaler Inhale 2 puffs into the lungs every 6 (six) hours as needed for wheezing or shortness of breath (use before exercise).    [provider]  ibuprofen (ADVIL) 600 MG tablet Take 1 tablet (600 mg total) by mouth every 6 (six) hours as needed. 09/29/19   Law, Waylan Boga, PA-C    Allergies    Augmentin [amoxicillin-pot clavulanate] and Keflex [cephalexin monohydrate]  Review of Systems   Review of Systems  Constitutional: Positive for fever.  HENT: Negative for congestion and sore throat.   Respiratory: Negative for cough and shortness of breath.   Gastrointestinal: Positive for nausea and vomiting. Negative for constipation and diarrhea.  Genitourinary: Negative for decreased urine volume, difficulty urinating and dysuria.  Musculoskeletal: Negative for arthralgias and myalgias.  Skin: Negative for rash and wound.  Allergic/Immunologic: Negative for immunocompromised state.  Neurological: Negative for weakness.  Hematological: Negative for adenopathy.  Psychiatric/Behavioral: Negative for confusion.  All other systems reviewed and are negative.   Physical Exam Updated Vital Signs BP  135/68   Pulse 90   Temp 100.1 F (37.8 C) (Oral)   Resp 18   Ht 6' (1.829 m)   Wt 87.1 kg   SpO2 98%   BMI 26.04 kg/m   Physical Exam Vitals and nursing note reviewed.  Constitutional:      General: He is not in acute distress.    Appearance: He is well-developed. He is not diaphoretic.  HENT:     Head: Normocephalic and atraumatic.     Mouth/Throat:     Mouth: Mucous  membranes are moist.     Pharynx: No oropharyngeal exudate or posterior oropharyngeal erythema.  Eyes:     Conjunctiva/sclera: Conjunctivae normal.  Cardiovascular:     Rate and Rhythm: Regular rhythm. Tachycardia present.     Pulses: Normal pulses.     Heart sounds: Normal heart sounds.  Pulmonary:     Effort: Pulmonary effort is normal.     Breath sounds: Normal breath sounds.  Abdominal:     Palpations: Abdomen is soft.     Tenderness: There is no abdominal tenderness.  Musculoskeletal:     Cervical back: Neck supple. No rigidity.     Right lower leg: No edema.     Left lower leg: No edema.  Lymphadenopathy:     Cervical: No cervical adenopathy.  Skin:    General: Skin is warm and dry.     Findings: No erythema or rash.  Neurological:     Mental Status: He is alert and oriented to person, place, and time.  Psychiatric:        Behavior: Behavior normal.     ED Results / Procedures / Treatments   Labs (all labs ordered are listed, but only abnormal results are displayed) Labs Reviewed  COMPREHENSIVE METABOLIC PANEL - Abnormal; Notable for the following components:      Result Value   Sodium 134 (*)    Potassium 3.1 (*)    Total Protein 8.4 (*)    Alkaline Phosphatase 36 (*)    Total Bilirubin 2.1 (*)    All other components within normal limits  CBC WITH DIFFERENTIAL/PLATELET - Abnormal; Notable for the following components:   WBC 19.0 (*)    Neutro Abs 15.0 (*)    Monocytes Absolute 1.4 (*)    Abs Immature Granulocytes 0.09 (*)    All other components within normal limits  SARS CORONAVIRUS 2 AG (30 MIN TAT)  GROUP A STREP BY PCR  CULTURE, BLOOD (ROUTINE X 2)  CULTURE, BLOOD (ROUTINE X 2)  LACTIC ACID, PLASMA  URINALYSIS, ROUTINE W REFLEX MICROSCOPIC  RAPID URINE DRUG SCREEN, HOSP PERFORMED  GC/CHLAMYDIA PROBE AMP ([place]) NOT AT [facility]    EKG None  Radiology DG Chest Port 1 View  Result Date: 12/17/2019 CLINICAL DATA:  Rib pain and fever. Shortness  of breath. EXAM: PORTABLE CHEST 1 VIEW COMPARISON:  Radiograph 11/18/2011 FINDINGS: The cardiomediastinal contours are normal. The lungs are clear. Pulmonary vasculature is normal. No consolidation, pleural effusion, or pneumothorax. No acute osseous abnormalities are seen. IMPRESSION: No acute chest findings.  Negative portable AP view of the chest. Electronically Signed   By: Keith Rake M.D.   On: 12/17/2019 20:14    Procedures Procedures (including critical care time)  Medications Ordered in ED Medications  iohexol (OMNIPAQUE) 300 MG/ML solution 100 mL (has no administration in time range)  acetaminophen (TYLENOL) tablet 650 mg (650 mg Oral Given 12/17/19 1943)  sodium chloride 0.9 % bolus 1,000 mL (1,000 mLs  Intravenous New Bag/Given 12/17/19 2208)    ED Course  I have reviewed the triage vital signs and the nursing notes.  Pertinent labs & imaging results that were available during my care of the patient were reviewed by me and considered in my medical decision making (see chart for details).  Clinical Course as of Dec 16 2256  Fri Dec 17, 2019  2361 24 year old male with complaint of lower rib pain and fever for the past few days without sick contacts.  Does report nausea and vomiting, denies abdominal pain.  Denies changes in bowel or bladder habits.  Patient denies any known sick contacts or respiratory symptoms.  No known contact to COVID-19.  On exam, patient is tachycardic but otherwise appears comfortable, no chest wall tenderness, no abdominal tenderness.  Patient is febrile on arrival with temperature 103.2, was given Tylenol with improvement in temperature and heart rate.  Review of lab work, patient has a leukocytosis with white count of 19,000 with increased neutrophils.  CMP with mild leukocytosis at 3.1, elevated bilirubin at 2.1 with normal LFTs.  Chest x-ray is unremarkable, rapid Covid is negative.  Lactic acid within normal limits, group A strep negative. Is on the  phone via video chat, does not seem to comfortably answer questions. Case discussed with Dr. Clarice Pole, ER attending who has seen the patient, questions bilateral CVA tenderness, will check UA, plan is to CT chest/abd/pelvis for unknown fever origin.    [LM]  2257 Care signed out to Dr. Read Drivers pending imaging and labs.   [LM]    Clinical Course User Index [LM] Alden Hipp   MDM Rules/Calculators/A&P                      Final Clinical Impression(s) / ED Diagnoses Final diagnoses:  Fever, unspecified  Leukocytosis, unspecified type    Rx / DC Orders ED Discharge Orders    None       Alden Hipp 12/17/19 2258    Arby Barrette, MD 12/17/19 2306

## 2019-12-18 LAB — RAPID URINE DRUG SCREEN, HOSP PERFORMED
Amphetamines: NOT DETECTED
Barbiturates: NOT DETECTED
Benzodiazepines: NOT DETECTED
Cocaine: NOT DETECTED
Opiates: NOT DETECTED
Tetrahydrocannabinol: POSITIVE — AB

## 2019-12-20 LAB — URINE CULTURE: Culture: 80000 — AB

## 2019-12-21 ENCOUNTER — Telehealth: Payer: Self-pay | Admitting: *Deleted

## 2019-12-21 LAB — GC/CHLAMYDIA PROBE AMP (~~LOC~~) NOT AT ARMC
Chlamydia: NEGATIVE
Neisseria Gonorrhea: NEGATIVE

## 2019-12-21 NOTE — Telephone Encounter (Signed)
Post ED Visit - Positive Culture Follow-up  Culture report reviewed by antimicrobial stewardship pharmacist: [facility] []  , Pharm.D. []  Enzo Bi, Pharm.D., BCPS AQ-ID []  , Pharm.D., BCPS []  Celedonio Miyamoto, Pharm.D., BCPS []  [place], Garvin Fila.D., BCPS, AAHIVP []  , Pharm.D., BCPS, AAHIVP []  Georgina Pillion, PharmD, BCPS []  , PharmD, BCPS []  [place], PharmD, BCPS []  [place], PharmD []  , PharmD, BCPS []  Estella Husk, PharmD , PharmD  [facility] []  , PharmD []  Phillips Climes, PharmD []  , PharmD []  Agapito Games, Rph []  ) Verlan Friends, PharmD []  , PharmD []  Mervyn Gay, PharmD []  , PharmD []  Vinnie Level, PharmD []  Corky Crafts, PharmD []  [facility], PharmD []  , PharmD []  Len Childs, PharmD   Positive urine culture Treated with Ciprofloxacin HCL, organism sensitive to the same and no further patient follow-up is required at this time.  [facility] 12/21/2019, 10:26 AM

## 2019-12-23 LAB — CULTURE, BLOOD (ROUTINE X 2)
Culture: NO GROWTH
Culture: NO GROWTH
Special Requests: ADEQUATE
Special Requests: ADEQUATE

## 2021-05-30 ENCOUNTER — Emergency Department (HOSPITAL_BASED_OUTPATIENT_CLINIC_OR_DEPARTMENT_OTHER)
Admission: EM | Admit: 2021-05-30 | Discharge: 2021-05-30 | Disposition: A | Discharge status: Home / Self Care | Payer: Medicaid Other | Attending: Emergency Medicine | Admitting: Emergency Medicine

## 2021-05-30 ENCOUNTER — Emergency Department (HOSPITAL_BASED_OUTPATIENT_CLINIC_OR_DEPARTMENT_OTHER): Payer: Medicaid Other

## 2021-05-30 ENCOUNTER — Encounter (HOSPITAL_BASED_OUTPATIENT_CLINIC_OR_DEPARTMENT_OTHER): Payer: Self-pay | Admitting: Emergency Medicine

## 2021-05-30 ENCOUNTER — Other Ambulatory Visit: Payer: Self-pay

## 2021-05-30 DIAGNOSIS — E876 Hypokalemia: Secondary | ICD-10-CM | POA: Insufficient documentation

## 2021-05-30 DIAGNOSIS — R079 Chest pain, unspecified: Secondary | ICD-10-CM

## 2021-05-30 DIAGNOSIS — F17290 Nicotine dependence, other tobacco product, uncomplicated: Secondary | ICD-10-CM | POA: Insufficient documentation

## 2021-05-30 DIAGNOSIS — J45909 Unspecified asthma, uncomplicated: Secondary | ICD-10-CM | POA: Insufficient documentation

## 2021-05-30 DIAGNOSIS — R42 Dizziness and giddiness: Secondary | ICD-10-CM | POA: Insufficient documentation

## 2021-05-30 LAB — CBC WITH DIFFERENTIAL/PLATELET
Abs Immature Granulocytes: 0.02 10*3/uL (ref 0.00–0.07)
Basophils Absolute: 0 10*3/uL (ref 0.0–0.1)
Basophils Relative: 0 %
Eosinophils Absolute: 0.4 10*3/uL (ref 0.0–0.5)
Eosinophils Relative: 5 %
HCT: 39.6 % (ref 39.0–52.0)
Hemoglobin: 13.7 g/dL (ref 13.0–17.0)
Immature Granulocytes: 0 %
Lymphocytes Relative: 35 %
Lymphs Abs: 2.9 10*3/uL (ref 0.7–4.0)
MCH: 29.3 pg (ref 26.0–34.0)
MCHC: 34.6 g/dL (ref 30.0–36.0)
MCV: 84.6 fL (ref 80.0–100.0)
Monocytes Absolute: 0.5 10*3/uL (ref 0.1–1.0)
Monocytes Relative: 6 %
Neutro Abs: 4.6 10*3/uL (ref 1.7–7.7)
Neutrophils Relative %: 54 %
Platelets: 291 10*3/uL (ref 150–400)
RBC: 4.68 MIL/uL (ref 4.22–5.81)
RDW: 11.9 % (ref 11.5–15.5)
WBC: 8.5 10*3/uL (ref 4.0–10.5)
nRBC: 0 % (ref 0.0–0.2)

## 2021-05-30 LAB — COMPREHENSIVE METABOLIC PANEL
ALT: 17 U/L (ref 0–44)
AST: 30 U/L (ref 15–41)
Albumin: 4.3 g/dL (ref 3.5–5.0)
Alkaline Phosphatase: 39 U/L (ref 38–126)
Anion gap: 8 (ref 5–15)
BUN: 7 mg/dL (ref 6–20)
CO2: 24 mmol/L (ref 22–32)
Calcium: 9.3 mg/dL (ref 8.9–10.3)
Chloride: 106 mmol/L (ref 98–111)
Creatinine, Ser: 0.84 mg/dL (ref 0.61–1.24)
GFR, Estimated: >60 mL/min (ref >60)
Glucose, Bld: 98 mg/dL (ref 70–99)
Potassium: 3 mmol/L — ABNORMAL LOW (ref 3.5–5.1)
Sodium: 138 mmol/L (ref 135–145)
Total Bilirubin: 0.7 mg/dL (ref 0.3–1.2)
Total Protein: 7.7 g/dL (ref 6.5–8.1)

## 2021-05-30 LAB — TROPONIN I (HIGH SENSITIVITY)
Troponin I (High Sensitivity): 4 ng/L (ref <18)
Troponin I (High Sensitivity): 3 ng/L (ref <18)

## 2021-05-30 LAB — CK: Total CK: 404 U/L — ABNORMAL HIGH (ref 49–397)

## 2021-05-30 NOTE — ED Triage Notes (Signed)
Pt reports chest pain that "comes and goes." Pt reports "I wore a heart monitor last year but it did not show anything." Denies N/V.

## 2021-05-30 NOTE — ED Provider Notes (Signed)
[facility] EMERGENCY DEPARTMENT Provider Note   CSN: [id number] Arrival date & time: 05/30/21  6948     History Chief Complaint  Patient presents with   Chest Pain    Warren Cox is a 25 y.o. male.  He has been having episodes of chest pain similar to this about once a week for a few months.  Today, the episode occurred approximately 2 hours ago.  He works at [employer], and there is a lack of air conditioning.  He notes that symptoms are worse when he is exerting himself.  He feels little bit dizzy but denies other symptoms associated with it.  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   Chest Pain Associated symptoms: dizziness   Associated symptoms: no abdominal pain, no back pain, no cough, no fever, no palpitations, no shortness of breath and no vomiting    HPI: A 25 year old patient presents for evaluation of chest pain. Initial onset of pain was approximately 1-3 hours ago. The patient's chest pain is sharp and is worse with exertion. The patient's chest pain is middle- or left-sided, is not well-localized, is not described as heaviness/pressure/tightness and does not radiate to the arms/jaw/neck. The patient does not complain of nausea and denies diaphoresis. The patient has smoked in the past 90 days. The patient has no history of stroke, has no history of peripheral artery disease, denies any history of treated diabetes, has no relevant family history of coronary artery disease (first degree relative at less than age 18), is not hypertensive, has no history of hypercholesterolemia and does not have an elevated BMI (>=30).   Past Medical History:  Diagnosis Date   Asthma     Patient Active Problem List   Diagnosis Date Noted   Left shoulder pain 07/01/2016   Right knee injury 05/23/2016   Crushing injury of left hand 09/01/2012   Left wrist injury 09/01/2012   Knee pain, left 12/06/2011    History reviewed. No pertinent surgical  history.     Family History  Problem Relation Age of Onset   Sudden death Neg Hx    Heart attack Neg Hx     Social History   Tobacco Use   Smoking status: Every Day    Types: Cigars   Smokeless tobacco: Never  Vaping Use   Vaping Use: Former  Substance Use Topics   Alcohol use: Yes    Alcohol/week: 0.0 standard drinks    Comment: occ   Drug use: No    Home Medications Prior to Admission medications   Medication Sig Start Date End Date Taking? Authorizing Provider  acetaminophen (TYLENOL) 500 MG tablet Take 2 tablets (1,000 mg total) by mouth every 8 (eight) hours as needed for moderate pain. 09/29/19   Law, Waylan Boga, PA-C  albuterol (PROVENTIL HFA;VENTOLIN HFA) 108 (90 BASE) MCG/ACT inhaler Inhale 2 puffs into the lungs every 6 (six) hours as needed for wheezing or shortness of breath (use before exercise).    [provider]  ciprofloxacin (CIPRO) 500 MG tablet Take 1 tablet (500 mg total) by mouth 2 (two) times daily. One po bid x 7 days 12/17/19   Molpus, John, MD  ibuprofen (ADVIL) 600 MG tablet Take 1 tablet (600 mg total) by mouth every 6 (six) hours as needed. 09/29/19   Law, Waylan Boga, PA-C    Allergies    Augmentin [amoxicillin-pot clavulanate] and Keflex [cephalexin monohydrate]  Review of Systems   Review of Systems  Constitutional:  Negative  for chills and fever.  HENT:  Negative for ear pain and sore throat.   Eyes:  Negative for pain and visual disturbance.  Respiratory:  Negative for cough and shortness of breath.   Cardiovascular:  Positive for chest pain. Negative for palpitations.  Gastrointestinal:  Negative for abdominal pain and vomiting.  Genitourinary:  Negative for dysuria and hematuria.  Musculoskeletal:  Negative for arthralgias and back pain.  Skin:  Negative for color change and rash.  Neurological:  Positive for dizziness. Negative for seizures and syncope.  All other systems reviewed and are negative.  Physical  Exam Updated Vital Signs BP 130/73 (BP Location: Right Arm)   Pulse 70   Temp 98.6 F (37 C) (Oral)   Resp 16   Ht 5\' 11"  (1.803 m)   Wt 88.5 kg   SpO2 100%   BMI 27.20 kg/m   Physical Exam Vitals and nursing note reviewed.  Constitutional:      Appearance: Normal appearance.  HENT:     Head: Normocephalic and atraumatic.  Cardiovascular:     Rate and Rhythm: Normal rate and regular rhythm.     Heart sounds: Normal heart sounds.  Pulmonary:     Effort: Pulmonary effort is normal.     Breath sounds: Examination of the right-upper field reveals wheezing. Wheezing present.  Abdominal:     General: There is no distension.     Tenderness: There is no abdominal tenderness. There is no guarding.  Musculoskeletal:     Cervical back: Normal range of motion.     Right lower leg: No edema.     Left lower leg: No edema.  Skin:    General: Skin is warm and dry.  Neurological:     General: No focal deficit present.     Mental Status: He is alert and oriented to person, place, and time.  Psychiatric:        Mood and Affect: Mood normal.        Behavior: Behavior normal.    ED Results / Procedures / Treatments   Labs (all labs ordered are listed, but only abnormal results are displayed) Labs Reviewed  COMPREHENSIVE METABOLIC PANEL - Abnormal; Notable for the following components:      Result Value   Potassium 3.0 (*)    All other components within normal limits  CK - Abnormal; Notable for the following components:   Total CK 404 (*)    All other components within normal limits  CBC WITH DIFFERENTIAL/PLATELET  TROPONIN I (HIGH SENSITIVITY)  TROPONIN I (HIGH SENSITIVITY)    EKG EKG Interpretation  Date/Time:  Wednesday May 30 2021 08:32:07 EDT Ventricular Rate:  72 PR Interval:  180 QRS Duration: 101 QT Interval:  374 QTC Calculation: 410 R Axis:   67 Text Interpretation: Sinus rhythm Atrial premature complex RSR' in V1 or V2, probably normal variant normal axis No  acute ischemia Confirmed by [id number] ([id number]) on 05/30/2021 8:58:45 AM  Radiology DG Chest 2 View  Result Date: 05/30/2021 CLINICAL DATA:  Chest pain intermittently for 2-3 days. Shortness of breath. EXAM: CHEST - 2 VIEW COMPARISON:  12/17/2019 FINDINGS: The heart size and mediastinal contours are within normal limits. Both lungs are clear. The visualized skeletal structures are unremarkable. IMPRESSION: No active cardiopulmonary disease. Electronically Signed   By: 02/14/2020   On: 05/30/2021 09:11    Procedures Procedures   Medications Ordered in ED Medications - No data to display  ED Course  I have reviewed  the triage vital signs and the nursing notes.  Pertinent labs & imaging results that were available during my care of the patient were reviewed by me and considered in my medical decision making (see chart for details).    MDM Rules/Calculators/A&P HEAR Score: 1                         Warren Cox presents with an episode of chest pain.  He was evaluated for evidence of ACS, pneumonia.  He was evaluated for rhabdomyolysis.  ED work-up was within normal limits.  He did have a slightly low potassium which is likely secondary to his work in a hot environment.  We discussed electrolyte repletion. Final Clinical Impression(s) / ED Diagnoses Final diagnoses:  Chest pain, unspecified type  Hypokalemia    Rx / DC Orders ED Discharge Orders     None        Koleen Distance, MD 05/30/21 1132

## 2021-09-13 ENCOUNTER — Encounter (HOSPITAL_BASED_OUTPATIENT_CLINIC_OR_DEPARTMENT_OTHER): Payer: Self-pay

## 2021-09-13 ENCOUNTER — Other Ambulatory Visit: Payer: Self-pay

## 2021-09-13 ENCOUNTER — Emergency Department (HOSPITAL_BASED_OUTPATIENT_CLINIC_OR_DEPARTMENT_OTHER): Payer: Self-pay

## 2021-09-13 ENCOUNTER — Emergency Department (HOSPITAL_BASED_OUTPATIENT_CLINIC_OR_DEPARTMENT_OTHER)
Admission: EM | Admit: 2021-09-13 | Discharge: 2021-09-13 | Disposition: A | Discharge status: Home / Self Care | Payer: Self-pay | Attending: Emergency Medicine | Admitting: Emergency Medicine

## 2021-09-13 DIAGNOSIS — R079 Chest pain, unspecified: Secondary | ICD-10-CM

## 2021-09-13 DIAGNOSIS — R0602 Shortness of breath: Secondary | ICD-10-CM | POA: Insufficient documentation

## 2021-09-13 DIAGNOSIS — F17290 Nicotine dependence, other tobacco product, uncomplicated: Secondary | ICD-10-CM | POA: Insufficient documentation

## 2021-09-13 DIAGNOSIS — R0789 Other chest pain: Secondary | ICD-10-CM | POA: Insufficient documentation

## 2021-09-13 DIAGNOSIS — J45909 Unspecified asthma, uncomplicated: Secondary | ICD-10-CM | POA: Insufficient documentation

## 2021-09-13 NOTE — ED Triage Notes (Signed)
Pt c/o left sided chest pain starting last night with intermittent shortness of breath. States has been having intermittent chest pain on and off for the past year.

## 2021-09-13 NOTE — Discharge Instructions (Addendum)
Take 4 over the counter ibuprofen tablets 3 times a day or 2 over-the-counter naproxen tablets twice a day for pain. Also take tylenol 1000mg (2 extra strength) four times a day.   Smoking is bad for you.  Please quit.  There is information in this paperwork that you can call on try and get assistance in quitting if you need to.

## 2021-09-13 NOTE — ED Provider Notes (Signed)
[facility]GENC[facility] DEPART[facility] Provider Note   CSN: [id number] Arrival date & time: 09/13/21  1922     History Chief Complaint  Patient presents with   Chest Pain    Warren Cox is a 25 y.o. male.  25 yo M with a chief complaint of chest pain.  This is been off and on for quite some time now.  The patient states that his pain is pinpoint in just left of the sternum.  Seems to be worse with deep breathing movement twisting.  He has been seen multiple times for this in the past.  Has had a Holter monitor that he reportedly had no events on and has had been seen in the ED for this previously.  He denies cough congestion or fever.  Denies trauma.  Patient denies history of MI, denies hypertension hyperlipidemia diabetes or smoking.  Denies family history of MI.  Patient denies history of PE or DVT denies hemoptysis denies unilateral lower extremity edema denies recent surgery immobilization hospitalization estrogen use or history of cancer.    The history is provided by the patient.  Chest Pain Pain location:  L chest Pain quality: sharp   Pain radiates to:  Does not radiate Pain severity:  Moderate Onset quality:  Gradual Duration:  24 months Timing:  Intermittent Progression:  Waxing and waning Chronicity:  New Relieved by:  Nothing Worsened by:  Coughing and deep breathing Ineffective treatments:  None tried Associated symptoms: shortness of breath   Associated symptoms: no abdominal pain, no fever, no headache, no palpitations and no vomiting       Past Medical History:  Diagnosis Date   Asthma     Patient Active Problem List   Diagnosis Date Noted   Left shoulder pain 07/01/2016   Right knee injury 05/23/2016   Crushing injury of left hand 09/01/2012   Left wrist injury 09/01/2012   Knee pain, left 12/06/2011    History reviewed. No pertinent surgical history.     Family History  Problem Relation Age of Onset   Sudden death Neg Hx     Heart attack Neg Hx     Social History   Tobacco Use   Smoking status: Every Day    Types: Cigars   Smokeless tobacco: Never  Vaping Use   Vaping Use: Former  Substance Use Topics   Alcohol use: Yes    Alcohol/week: 0.0 standard drinks    Comment: occ   Drug use: No    Home Medications Prior to Admission medications   Medication Sig Start Date End Date Taking? Authorizing Provider  acetaminophen (TYLENOL) 500 MG tablet Take 2 tablets (1,000 mg total) by mouth every 8 (eight) hours as needed for moderate pain. 09/29/19   Law, Bea Graff, PA-C  albuterol (PROVENTIL HFA;VENTOLIN HFA) 108 (90 BASE) MCG/ACT inhaler Inhale 2 puffs into the lungs every 6 (six) hours as needed for wheezing or shortness of breath (use before exercise).    [provider]  ciprofloxacin (CIPRO) 500 MG tablet Take 1 tablet (500 mg total) by mouth 2 (two) times daily. One po bid x 7 days 12/17/19   Molpus, John, MD  ibuprofen (ADVIL) 600 MG tablet Take 1 tablet (600 mg total) by mouth every 6 (six) hours as needed. 09/29/19   Law, Bea Graff, PA-C    Allergies    Augmentin [amoxicillin-pot clavulanate] and Keflex [cephalexin monohydrate]  Review of Systems   Review of Systems  Constitutional:  Negative for chills and  fever.  HENT:  Negative for congestion and facial swelling.   Eyes:  Negative for discharge and visual disturbance.  Respiratory:  Positive for shortness of breath.   Cardiovascular:  Positive for chest pain. Negative for palpitations.  Gastrointestinal:  Negative for abdominal pain, diarrhea and vomiting.  Musculoskeletal:  Negative for arthralgias and myalgias.  Skin:  Negative for color change and rash.  Neurological:  Negative for tremors, syncope and headaches.  Psychiatric/Behavioral:  Negative for confusion and dysphoric mood.    Physical Exam Updated Vital Signs BP 139/76 (BP Location: Right Arm)   Pulse 69   Temp 98.4 F (36.9 C) (Oral)   Resp 18   Ht 5\' 11"   (1.803 m)   Wt 95.7 kg   SpO2 100%   BMI 29.43 kg/m   Physical Exam Vitals and nursing note reviewed.  Constitutional:      Appearance: He is well-developed.  HENT:     Head: Normocephalic and atraumatic.  Eyes:     Pupils: Pupils are equal, round, and reactive to light.  Neck:     Vascular: No JVD.  Cardiovascular:     Rate and Rhythm: Normal rate and regular rhythm.     Heart sounds: No murmur heard.   No friction rub. No gallop.  Pulmonary:     Effort: No respiratory distress.     Breath sounds: No wheezing.  Chest:     Chest wall: Tenderness (pain about the left sternal boarder.) present.  Abdominal:     General: There is no distension.     Tenderness: There is no abdominal tenderness. There is no guarding or rebound.  Musculoskeletal:        General: Normal range of motion.     Cervical back: Normal range of motion and neck supple.  Skin:    Coloration: Skin is not pale.     Findings: No rash.  Neurological:     Mental Status: He is alert and oriented to person, place, and time.  Psychiatric:        Behavior: Behavior normal.    ED Results / Procedures / Treatments   Labs (all labs ordered are listed, but only abnormal results are displayed) Labs Reviewed - No data to display  EKG EKG Interpretation  Date/Time:  Thursday September 13 2021 19:30:57 EST Ventricular Rate:  65 PR Interval:  173 QRS Duration: 95 QT Interval:  377 QTC Calculation: 392 R Axis:   69 Text Interpretation: Sinus rhythm No significant change since last tracing Confirmed by [phone] [id number]) on 09/13/2021 7:56:30 PM  Radiology DG Chest Port 1 View  Result Date: 09/13/2021 CLINICAL DATA:  Chest pain EXAM: PORTABLE CHEST 1 VIEW COMPARISON:  05/30/2021 FINDINGS: The heart size and mediastinal contours are within normal limits. Both lungs are clear. The visualized skeletal structures are unremarkable. IMPRESSION: No active disease. Electronically Signed   By: 06/01/2021 M.D.   On:  09/13/2021 20:02    Procedures Procedures   Medications Ordered in ED Medications - No data to display  ED Course  I have reviewed the triage vital signs and the nursing notes.  Pertinent labs & imaging results that were available during my care of the patient were reviewed by me and considered in my medical decision making (see chart for details).    MDM Rules/Calculators/A&P                           25 yo M  with a chief complaints of left-sided chest pain.  This is atypical in nature and reproduced on exam.  EKG without concerning finding.  Will obtain a plain film of the chest.  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  We will have him follow-up with his family doctor in the office.  8:14 PM:  I have discussed the diagnosis/risks/treatment options with the patient and believe the pt to be eligible for discharge home to follow-up with PCP. We also discussed returning to the ED immediately if new or worsening sx occur. We discussed the sx which are most concerning (e.g., sudden worsening pain, fever, inability to tolerate by mouth) that necessitate immediate return. Medications administered to the patient during their visit and any new prescriptions provided to the patient are listed below.  Medications given during this visit Medications - No data to display   The patient appears reasonably screen and/or stabilized for discharge and I doubt any other medical condition or other [facility] requiring further screening, evaluation, or treatment in the ED at this time prior to discharge.   Final Clinical Impression(s) / ED Diagnoses Final diagnoses:  Nonspecific chest pain    Rx / DC Orders ED Discharge Orders     None        Deno Etienne, DO 09/13/21 2014

## 2022-07-26 ENCOUNTER — Emergency Department (HOSPITAL_BASED_OUTPATIENT_CLINIC_OR_DEPARTMENT_OTHER)
Admission: EM | Admit: 2022-07-26 | Discharge: 2022-07-26 | Disposition: A | Discharge status: Home / Self Care | Payer: Medicaid Other | Attending: Emergency Medicine | Admitting: Emergency Medicine

## 2022-07-26 ENCOUNTER — Other Ambulatory Visit: Payer: Self-pay

## 2022-07-26 ENCOUNTER — Emergency Department (HOSPITAL_BASED_OUTPATIENT_CLINIC_OR_DEPARTMENT_OTHER): Payer: Medicaid Other

## 2022-07-26 ENCOUNTER — Encounter (HOSPITAL_BASED_OUTPATIENT_CLINIC_OR_DEPARTMENT_OTHER): Payer: Self-pay | Admitting: Emergency Medicine

## 2022-07-26 DIAGNOSIS — R0981 Nasal congestion: Secondary | ICD-10-CM | POA: Insufficient documentation

## 2022-07-26 DIAGNOSIS — Z20822 Contact with and (suspected) exposure to covid-19: Secondary | ICD-10-CM | POA: Insufficient documentation

## 2022-07-26 DIAGNOSIS — R0602 Shortness of breath: Secondary | ICD-10-CM | POA: Insufficient documentation

## 2022-07-26 LAB — SARS CORONAVIRUS 2 BY RT PCR: SARS Coronavirus 2 by RT PCR: NEGATIVE

## 2022-07-26 NOTE — ED Triage Notes (Signed)
Visitor states pt has had a mild cough and congestion for the past two days   About an hour ago the pt sneezed and then states he could not catch his breath  Pt has short jerky breathing and is uncooperative in triage  Pt brought back to room 9

## 2022-07-26 NOTE — ED Provider Notes (Signed)
[facility] EMERGENCY DEPARTMENT Provider Note   CSN: [id number] Arrival date & time: 07/26/22  0547     History  Chief Complaint  Patient presents with   Shortness of Breath    Warren Cox is a 26 y.o. male.  The history is provided by the patient.  Shortness of Breath Severity:  Severe Onset quality:  Sudden Timing:  Constant Progression:  Unchanged Chronicity:  New Context comment:  Nasal congestion and sneezing Relieved by:  Nothing Worsened by:  Nothing Ineffective treatments:  None tried Associated symptoms: no abdominal pain, no chest pain, no claudication, no cough, no diaphoresis, no ear pain, no sore throat, no sputum production, no syncope, no swollen glands, no vomiting and no wheezing   Risk factors: no recent alcohol use        Home Medications Prior to Admission medications   Medication Sig Start Date End Date Taking? Authorizing Provider  acetaminophen (TYLENOL) 500 MG tablet Take 2 tablets (1,000 mg total) by mouth every 8 (eight) hours as needed for moderate pain. 09/29/19   Law, Waylan Boga, PA-C  albuterol (PROVENTIL HFA;VENTOLIN HFA) 108 (90 BASE) MCG/ACT inhaler Inhale 2 puffs into the lungs every 6 (six) hours as needed for wheezing or shortness of breath (use before exercise).    [provider]  ciprofloxacin (CIPRO) 500 MG tablet Take 1 tablet (500 mg total) by mouth 2 (two) times daily. One po bid x 7 days 12/17/19   Molpus, John, MD  ibuprofen (ADVIL) 600 MG tablet Take 1 tablet (600 mg total) by mouth every 6 (six) hours as needed. 09/29/19   Law, Waylan Boga, PA-C      Allergies    Augmentin [amoxicillin-pot clavulanate] and Keflex [cephalexin monohydrate]    Review of Systems   Review of Systems  Constitutional:  Negative for diaphoresis.  HENT:  Negative for ear pain and sore throat.   Eyes:  Negative for redness.  Respiratory:  Positive for shortness of breath. Negative for cough, sputum production and wheezing.    Cardiovascular:  Negative for chest pain, claudication and syncope.  Gastrointestinal:  Negative for abdominal pain and vomiting.  All other systems reviewed and are negative.   Physical Exam Updated Vital Signs BP (!) 154/87 (BP Location: Right Arm)   Pulse (!) 110   Temp 98.8 F (37.1 C) (Oral)   Resp (!) 28   Ht 5\' 10"  (1.778 m)   Wt 103.4 kg   SpO2 100%   BMI 32.70 kg/m  Physical Exam Vitals and nursing note reviewed.  Constitutional:      General: He is not in acute distress.    Appearance: He is well-developed. He is not diaphoretic.  HENT:     Head: Normocephalic and atraumatic.     Nose: Nose normal.  Eyes:     Conjunctiva/sclera: Conjunctivae normal.     Pupils: Pupils are equal, round, and reactive to light.  Cardiovascular:     Rate and Rhythm: Normal rate and regular rhythm.     Pulses: Normal pulses.     Heart sounds: Normal heart sounds.  Pulmonary:     Effort: Pulmonary effort is normal.     Breath sounds: Normal breath sounds. No wheezing or rales.  Abdominal:     General: Bowel sounds are normal.     Palpations: Abdomen is soft.     Tenderness: There is no abdominal tenderness. There is no guarding or rebound.  Musculoskeletal:        General:  Normal range of motion.     Cervical back: Normal range of motion and neck supple.  Skin:    General: Skin is warm and dry.  Neurological:     Mental Status: He is alert and oriented to person, place, and time.     ED Results / Procedures / Treatments   Labs (all labs ordered are listed, but only abnormal results are displayed) Labs Reviewed  SARS CORONAVIRUS 2 BY RT PCR    EKG None  Radiology DG Chest Portable 1 View  Result Date: 07/26/2022 CLINICAL DATA:  26 year old male with history of coughing congestion for the past 2 days. EXAM: PORTABLE CHEST 1 VIEW COMPARISON:  Chest x-ray 09/13/2021. FINDINGS: Lung volumes are low. No consolidative airspace disease. No pleural effusions. No  pneumothorax. No pulmonary nodule or mass noted. Pulmonary vasculature and the cardiomediastinal silhouette are within normal limits. IMPRESSION: 1. Low lung volumes without radiographic evidence of acute cardiopulmonary disease. Electronically Signed   By: Vinnie Langton M.D.   On: 07/26/2022 06:30    Procedures Procedures    Medications Ordered in ED Medications - No data to display  ED Course/ Medical Decision Making/ A&P                           Medical Decision Making Sneezed and then had nasal congestion and breathing felt weird   Problems Addressed: Nasal congestion:    Details: Flonase and zyrtec   Amount and/or Complexity of Data Reviewed Independent Historian: friend    Details: See above  External Data Reviewed: notes.    Details: Previous notes reviewed  Labs: ordered.    Details: Covid is negative  Radiology: ordered and independent interpretation performed.    Details: Normal chest Xray.    Risk Risk Details: Well appearing breaths fine through mouth, normal oxygen saturation.     Final Clinical Impression(s) / ED Diagnoses Final diagnoses:  Nasal congestion   Return for intractable cough, coughing up blood, fevers > 100.4 unrelieved by medication, shortness of breath, intractable vomiting, chest pain, shortness of breath, weakness, numbness, changes in speech, facial asymmetry, abdominal pain, passing out, Inability to tolerate liquids or food, cough, altered mental status or any concerns. No signs of systemic illness or infection. The patient is nontoxic-appearing on exam and vital signs are within normal limits.  I have reviewed the triage vital signs and the nursing notes. Pertinent labs & imaging results that were available during my care of the patient were reviewed by me and considered in my medical decision making (see chart for details). After history, exam, and medical workup I feel the patient has been appropriately medically screened and is safe for  discharge home. Pertinent diagnoses were discussed with the patient. Patient was given return precautions.  Rx / DC Orders ED Discharge Orders     None         Karyss Frese, MD 07/26/22 [phone]

## 2022-07-26 NOTE — ED Notes (Signed)
Reviewed discharge instructions and recommendations with pt. States understanding. Ambulatory upon discharge with family

## 2023-03-25 NOTE — Nursing Note (Signed)
   03/25/23 1132  Rapid Rounds  Attendance Nursing;Physician;Patient;Case Manager  Patient expects to be discharged to: Home  Today we still await: Clinical stability;Other (Comment)  Additional comments: s/p loop recorder- plan for dc today; no Cm needs

## 2023-03-25 NOTE — Care Plan (Signed)
  Problem: Adult Inpatient Plan of Care Goal: Readiness for Transition of Care Outcome: Resolved  DC home; no CM needs

## 2023-03-25 NOTE — Care Plan (Signed)
  Problem: Adult Inpatient Plan of Care Goal: Plan of Care Review Outcome: Discharged to Home Goal: Patient-Specific Goal (Individualized) Outcome: Discharged to Home Goal: Absence of Hospital-Acquired Illness or Injury Outcome: Discharged to Home Goal: Optimal Comfort and Wellbeing Outcome: Discharged to Home Goal: Rounds/Family Conference Outcome: Discharged to Home   Problem: Cardiac Rhythm Management Device Goal: Optimal Adjustment to Device Outcome: Discharged to Home Goal: Absence of Bleeding Outcome: Discharged to Home Goal: Effective Device Function Outcome: Discharged to Home Goal: Absence of Infection Signs and Symptoms Outcome: Discharged to Home Goal: Acceptable Pain Level Outcome: Discharged to Home Goal: Effective Oxygenation and Ventilation Outcome: Discharged to Home   Problem: Wound Goal: Optimal Coping Outcome: Discharged to Home Goal: Optimal Functional Ability Outcome: Discharged to Home Goal: Absence of Infection Signs and Symptoms Outcome: Discharged to Home Goal: Improved Oral Intake Outcome: Discharged to Home Goal: Optimal Pain Control and Function Outcome: Discharged to Home Goal: Skin Health and Integrity Outcome: Discharged to Home Goal: Optimal Wound Healing Outcome: Discharged to Home

## 2023-03-25 NOTE — Nursing Note (Signed)
 Report called to Harlene, Charity fundraiser. Explained that dressing needs to remain on, clean and dry until his follow up appt in 1wk. No questions at this time.

## 2023-03-25 NOTE — Progress Notes (Signed)
 Patient received loop recorder implant with [employer] device without complication for recurrent syncope.    The recommendation and the plan are as follows: 1.  Patient should be on antibiotics prophylaxis with clindamycin for 3 days because of allergic to penicillin. 2.  Pain control with Tylenol  #3 PRN for 3 days. 3.  From cardiac standpoint, patient can be discharged home today.

## 2023-03-25 NOTE — Nursing Note (Signed)
   03/25/23 1100  Final Assessment  Patient's Post Acute Contact Information [phone]  Has a specialist appointment been made?  (Cardiology)  Home Care/ Home Medical Equipment needed at discharge? No  Outpatient/Community Referrals needed for discharge? No  Currently receiving outpatient dialysis? No  Discharge Disposition Home w/ Self Care  Transportation Anticipated family or friend will provide  Quality data for continuing care services shared with patient and/or representative? N/A  Patient and/or family were provided with choice of facilities / services that are available and appropriate to meet post hospital care needs? N/A  Discharge Packet Contents Other, see Notes (primary nurse)  IMM Delivery  Follow Up Important Message (IM) letter given to patient and/or family? N/A  Final Assessment Complete  Final Assessment Complete Yes

## 2023-04-01 NOTE — Progress Notes (Signed)
-------------------------------------------------------------------------------   Summary: INCISION SITE CHECK -------------------------------------------------------------------------------  Pt in for wound check of surgical site to mid chest s/p loop recorder implant.  Dressing/steri-strips already fell off per patient.  No signs/symptoms of infection noted. Denies pain. Instructed patient to notify office of any signs/symptoms of infection.  Redness, swelling, pain, drainage, fever.  Also, instructed OK to shower but to let water run over incision without rubbing for 2 weeks.    INTERROGATION SCANNED INTO MEDIA/ATTACHED TO NURSE VISIT

## 2024-03-01 ENCOUNTER — Encounter (HOSPITAL_BASED_OUTPATIENT_CLINIC_OR_DEPARTMENT_OTHER): Payer: Self-pay | Admitting: Emergency Medicine

## 2024-03-01 ENCOUNTER — Emergency Department (HOSPITAL_BASED_OUTPATIENT_CLINIC_OR_DEPARTMENT_OTHER)
Admission: EM | Admit: 2024-03-01 | Discharge: 2024-03-01 | Disposition: A | Discharge status: Home / Self Care | Attending: Emergency Medicine | Admitting: Emergency Medicine

## 2024-03-01 ENCOUNTER — Other Ambulatory Visit: Payer: Self-pay

## 2024-03-01 DIAGNOSIS — W228XXA Striking against or struck by other objects, initial encounter: Secondary | ICD-10-CM | POA: Diagnosis not present

## 2024-03-01 DIAGNOSIS — F17290 Nicotine dependence, other tobacco product, uncomplicated: Secondary | ICD-10-CM | POA: Insufficient documentation

## 2024-03-01 DIAGNOSIS — Y990 Civilian activity done for income or pay: Secondary | ICD-10-CM | POA: Insufficient documentation

## 2024-03-01 DIAGNOSIS — Z7951 Long term (current) use of inhaled steroids: Secondary | ICD-10-CM | POA: Insufficient documentation

## 2024-03-01 DIAGNOSIS — S0502XA Injury of conjunctiva and corneal abrasion without foreign body, left eye, initial encounter: Secondary | ICD-10-CM | POA: Insufficient documentation

## 2024-03-01 DIAGNOSIS — J45909 Unspecified asthma, uncomplicated: Secondary | ICD-10-CM | POA: Insufficient documentation

## 2024-03-01 MED ORDER — TETRACAINE HCL 0.5 % OP SOLN
1.0000 [drp] | Freq: Once | OPHTHALMIC | Status: AC
  Administered 2024-03-01: 1 [drp] via OPHTHALMIC
  Filled 2024-03-01: qty 4

## 2024-03-01 MED ORDER — OFLOXACIN 0.3 % OP SOLN
1.0000 [drp] | Freq: Four times a day (QID) | OPHTHALMIC | Status: DC
  Administered 2024-03-01: 1 [drp] via OPHTHALMIC
  Filled 2024-03-01: qty 5

## 2024-03-01 MED ORDER — FLUORESCEIN SODIUM 1 MG OP STRP
1.0000 | ORAL_STRIP | Freq: Once | OPHTHALMIC | Status: AC
  Administered 2024-03-01: 1 via OPHTHALMIC
  Filled 2024-03-01: qty 1

## 2024-03-01 NOTE — Discharge Instructions (Addendum)
 We evaluated you for your eye pain and injury.  Your examination shows a small scrape on your cornea (the outside part of your eye).  This usually heals on its own.  We have given you antibiotic drops to help protect your eye.  Please do not wear your contact lenses until your cleared by your optometrist or ophthalmologist.  Please schedule a follow-up appointment with Dr. Orlena Bitters so that he can check to make sure that your eye is healing appropriately.  Please take Tylenol  (acetaminophen ) and Motrin  (ibuprofen ) for your symptoms at home.  You can take 1000 mg of Tylenol  every 6 hours and 600 mg of Motrin  every 6 hours as needed for your symptoms.  You can take these medicines together as needed, either at the same time, or alternating every 3 hours.   We have given you antibiotic drops to take home.  Please use these 4 times daily for 1 week.  Please return if you develop any new or worsening symptoms such as loss of vision, severe or worsening pain, fevers or chills, or any other concerning symptoms.

## 2024-03-01 NOTE — ED Triage Notes (Signed)
 Patient presents with left eye pain and redness since earlier today. States "I was pulling something while at work and hit myself".

## 2024-03-01 NOTE — ED Provider Notes (Signed)
 [place] EMERGENCY DEPARTMENT AT [facility] Provider Note  CSN: [id number] Arrival date & time: 03/01/24 2022  Chief Complaint(s) Eye Injury  HPI Warren Cox is a 28 y.o. male without relevant past medical history presenting to the emergency department with left eye pain.  Patient reports that he was at work, working on something, pulled object and hit him in the eye.  Not doing anything unusual like welding or grinding metal.  Reports left eye pain, blurry vision.  Does typically wear contact lenses but not wearing them currently.  No fevers or chills.   Past Medical History Past Medical History:  Diagnosis Date   Asthma    Patient Active Problem List   Diagnosis Date Noted   Left shoulder pain 07/01/2016   Right knee injury 05/23/2016   Crushing injury of left hand 09/01/2012   Left wrist injury 09/01/2012   Knee pain, left 12/06/2011   Home Medication(s) Prior to Admission medications   Medication Sig Start Date End Date Taking? Authorizing Provider  acetaminophen  (TYLENOL ) 500 MG tablet Take 2 tablets (1,000 mg total) by mouth every 8 (eight) hours as needed for moderate pain. 09/29/19   Law, Alexandra M, PA-C  albuterol (PROVENTIL HFA;VENTOLIN HFA) 108 (90 BASE) MCG/ACT inhaler Inhale 2 puffs into the lungs every 6 (six) hours as needed for wheezing or shortness of breath (use before exercise).    [provider]  ciprofloxacin  (CIPRO ) 500 MG tablet Take 1 tablet (500 mg total) by mouth 2 (two) times daily. One po bid x 7 days 12/17/19   Molpus, John, MD  ibuprofen  (ADVIL ) 600 MG tablet Take 1 tablet (600 mg total) by mouth every 6 (six) hours as needed. 09/29/19   Martie Slaughter, PA-C                                                                                                                                    Past Surgical History History reviewed. No pertinent surgical history. Family History Family History  Problem Relation Age of Onset    Sudden death Neg Hx    Heart attack Neg Hx     Social History Social History   Tobacco Use   Smoking status: Every Day    Types: Cigars   Smokeless tobacco: Never  Vaping Use   Vaping status: Every Day   Substances: Nicotine  Substance Use Topics   Alcohol use: Yes    Alcohol/week: 0.0 standard drinks of alcohol    Comment: occ   Drug use: Yes    Types: Marijuana   Allergies Augmentin [amoxicillin-pot clavulanate] and Keflex [cephalexin monohydrate]  Review of Systems Review of Systems  All other systems reviewed and are negative.   Physical Exam Vital Signs  I have reviewed the triage vital signs BP 134/83 (BP Location: Left Arm)   Pulse (!) 57   Temp 97.8 F (36.6 C)   Resp 20  SpO2 99%  Physical Exam Vitals and nursing note reviewed.  Constitutional:      General: He is not in acute distress.    Appearance: Normal appearance.  HENT:     Head: Normocephalic and atraumatic.     Mouth/Throat:     Mouth: Mucous membranes are moist.  Eyes:     General: Lids are normal. Lids are everted, no foreign bodies appreciated. Vision grossly intact.        Right eye: No foreign body.        Left eye: No foreign body.     Extraocular Movements:     Right eye: Normal extraocular motion.     Left eye: Normal extraocular motion.     Conjunctiva/sclera:     Right eye: Right conjunctiva is not injected.     Left eye: Left conjunctiva is injected.     Pupils: Pupils are equal, round, and reactive to light.     Right eye: No fluorescein uptake. Seidel exam negative.     Left eye: Fluorescein uptake present. Seidel exam negative.  Cardiovascular:     Rate and Rhythm: Normal rate.  Pulmonary:     Effort: Pulmonary effort is normal. No respiratory distress.  Abdominal:     General: Abdomen is flat.  Skin:    General: Skin is warm and dry.     Capillary Refill: Capillary refill takes less than 2 seconds.  Neurological:     General: No focal deficit present.     Mental  Status: He is alert. Mental status is at baseline.  Psychiatric:        Mood and Affect: Mood normal.        Behavior: Behavior normal.     ED Results and Treatments Labs (all labs ordered are listed, but only abnormal results are displayed) Labs Reviewed - No data to display                                                                                                                        Radiology No results found.  Pertinent labs & imaging results that were available during my care of the patient were reviewed by me and considered in my medical decision making (see MDM for details).  Medications Ordered in ED Medications  ofloxacin (OCUFLOX) 0.3 % ophthalmic solution 1 drop (1 drop Left Eye Given 03/01/24 2214)  tetracaine (PONTOCAINE) 0.5 % ophthalmic solution 1 drop (1 drop Both Eyes Given 03/01/24 2149)  fluorescein ophthalmic strip 1 strip (1 strip Both Eyes Given 03/01/24 2149)  Procedures Procedures  (including critical care time)  Medical Decision Making / ED Course   MDM:  28 year old with eye injury.  On exam, patient has a corneal abrasion.  No evidence of open globe, negative Seidel sign.  His pain entirely resolved after receiving drop of tetracaine.  He does wear contact lenses, so we will prescribe ofloxacin.  This was given to the patient in the emergency department.  Instructions for use were discussed with the patient listed on the discharge instructions.  Advise no contact lens use until cleared by ophthalmology.  No sign of any foreign body.  Recommended close follow-up with ophthalmology.  Will discharge patient to home. All questions answered. Patient comfortable with plan of discharge. Return precautions discussed with patient and specified on the after visit summary.       Medicines ordered and prescription drug  management: Meds ordered this encounter  Medications   tetracaine (PONTOCAINE) 0.5 % ophthalmic solution 1 drop   fluorescein ophthalmic strip 1 strip   ofloxacin (OCUFLOX) 0.3 % ophthalmic solution 1 drop    -I have reviewed the patients home medicines and have made adjustments as needed   Reevaluation: After the interventions noted above, I reevaluated the patient and found that their symptoms have improved  Co morbidities that complicate the patient evaluation  Past Medical History:  Diagnosis Date   Asthma       Dispostion: Disposition decision including need for hospitalization was considered, and patient discharged from emergency department.    Final Clinical Impression(s) / ED Diagnoses Final diagnoses:  Abrasion of left cornea, initial encounter     This chart was dictated using voice recognition software.  Despite best efforts to proofread,  errors can occur which can change the documentation meaning.    Mordecai Applebaum, MD 03/01/24 2221

## 2024-04-18 ENCOUNTER — Encounter (HOSPITAL_BASED_OUTPATIENT_CLINIC_OR_DEPARTMENT_OTHER): Payer: Self-pay | Admitting: *Deleted

## 2024-04-18 ENCOUNTER — Other Ambulatory Visit: Payer: Self-pay

## 2024-04-18 ENCOUNTER — Emergency Department (HOSPITAL_BASED_OUTPATIENT_CLINIC_OR_DEPARTMENT_OTHER)
Admission: EM | Admit: 2024-04-18 | Discharge: 2024-04-18 | Disposition: A | Discharge status: Home / Self Care | Attending: Emergency Medicine | Admitting: Emergency Medicine

## 2024-04-18 DIAGNOSIS — A749 Chlamydial infection, unspecified: Secondary | ICD-10-CM | POA: Insufficient documentation

## 2024-04-18 DIAGNOSIS — Z202 Contact with and (suspected) exposure to infections with a predominantly sexual mode of transmission: Secondary | ICD-10-CM | POA: Diagnosis present

## 2024-04-18 MED ORDER — DOXYCYCLINE HYCLATE 100 MG PO CAPS: 100.0000 mg | ORAL_CAPSULE | Freq: Two times a day (BID) | ORAL | 0 refills | Status: AC

## 2024-04-18 NOTE — ED Provider Notes (Signed)
 [place] EMERGENCY DEPARTMENT AT [facility] Provider Note   CSN: [id number] Arrival date & time: 04/18/24  1455     Patient presents with: Exposure to STD   Warren Cox is a 28 y.o. male.  Patient presents to the emergency department with concerns of STI exposure.  He reports he is sexually active and was informed by partner that they tested positive for chlamydia about 2 or 3 days ago.  Patient currently denies any symptoms such as dysuria, penile discharge, rash, or painful ejaculation.  He is requesting STI evaluation given this positive result.  Does not want testing for syphilis or HIV.   Exposure to STD       Prior to Admission medications   Medication Sig Start Date End Date Taking? Authorizing Provider  doxycycline (VIBRAMYCIN) 100 MG capsule Take 1 capsule (100 mg total) by mouth 2 (two) times daily for 7 days. 04/18/24 04/25/24 Yes Nakeesha Bowler A, PA-C  acetaminophen  (TYLENOL ) 500 MG tablet Take 2 tablets (1,000 mg total) by mouth every 8 (eight) hours as needed for moderate pain. 09/29/19   Law, Alexandra M, PA-C  albuterol (PROVENTIL HFA;VENTOLIN HFA) 108 (90 BASE) MCG/ACT inhaler Inhale 2 puffs into the lungs every 6 (six) hours as needed for wheezing or shortness of breath (use before exercise).    [provider]  ciprofloxacin  (CIPRO ) 500 MG tablet Take 1 tablet (500 mg total) by mouth 2 (two) times daily. One po bid x 7 days 12/17/19   Molpus, John, MD  ibuprofen  (ADVIL ) 600 MG tablet Take 1 tablet (600 mg total) by mouth every 6 (six) hours as needed. 09/29/19   Law, Alexandra M, PA-C    Allergies: Augmentin [amoxicillin-pot clavulanate] and Keflex [cephalexin monohydrate]    Review of Systems  Genitourinary:        STI exposure  All other systems reviewed and are negative.   Updated Vital Signs BP 132/77 (BP Location: Left Arm)   Pulse 70   Temp 98 F (36.7 C) (Oral)   Resp 18   SpO2 98%   Physical Exam Vitals and nursing note  reviewed.  Constitutional:      General: He is not in acute distress.    Appearance: He is well-developed.  HENT:     Head: Normocephalic and atraumatic.   Eyes:     Conjunctiva/sclera: Conjunctivae normal.   Pulmonary:     Effort: Pulmonary effort is normal.     Breath sounds: Normal breath sounds.  Abdominal:     Palpations: Abdomen is soft.     Tenderness: There is no abdominal tenderness. There is no guarding.   Musculoskeletal:        General: No swelling.     Cervical back: Neck supple.   Skin:    General: Skin is warm and dry.     Capillary Refill: Capillary refill takes less than 2 seconds.   Neurological:     Mental Status: He is alert.   Psychiatric:        Mood and Affect: Mood normal.     (all labs ordered are listed, but only abnormal results are displayed) Labs Reviewed  GC/CHLAMYDIA PROBE AMP ([place]) NOT AT [facility]    EKG: None  Radiology: No results found.  Procedures   Medications Ordered in the ED - No data to display  Medical Decision Making Risk Prescription drug management.   This patient presents to the ED for concern of STI concern.  Differential diagnosis includes gonorrhea, syphilis, HIV   Lab Tests:  I Ordered, and personally interpreted labs.  The pertinent results include: GC/chlamydia pending    Problem List / ED Course:  Patient presents the emergency department today with concerns of possible STI exposure.  He reports that he received a phone call from a partner who had tested positive for chlamydia.  This is due to 3 days ago.  He currently denies any symptoms such as dysuria, increased urinary frequency or urgency, penile discharge, or urethral irritation.  Denies any rashes.  Requesting STI testing. Physical exam is unremarkable.  Patient would prefer to defer GU exam at this time.  With lack of focal symptoms and no reported testicular pain or rashes, do not feel a GU exam would  be of benefit at this time. Given concerns, advised patient that gonorrhea and chlamydia would be tested through a urine test.  With known exposure to a positive chlamydia case, we will start patient on prophylactic treatment of doxycycline twice daily for 7 days.  He denies any concerns for gonorrhea as this was not reported to him but will also have this tested and hold off on treatment until this result is available.  Patient agreeable this plan.  Advise close follow-up with PCP for further evaluation.  Return precautions discussed.  Otherwise stable at this time for outpatient follow-up and discharged home.  Final diagnoses:  Exposure to chlamydia    ED Discharge Orders          Ordered    doxycycline (VIBRAMYCIN) 100 MG capsule  2 times daily        04/18/24 1635               Khrystyne Arpin A, PA-C 04/18/24 1639    Dalene Duck, MD 04/18/24 [phone]

## 2024-04-18 NOTE — ED Triage Notes (Signed)
 Pt would like to be STD tested.  He was told 2-3 days ago that he was exposed to chlamydia.  Pt is asymptomatic.

## 2024-04-18 NOTE — Discharge Instructions (Addendum)
 You were seen in the emergency department today for concerns of exposure to an STI.  Based on the results that you were exposed to chlamydia, I started you on a prescription for doxycycline which you should take twice daily for the next 7 days.  Since there is no other known exposure to any other STIs, we will hold off on any other treatment until results are available.  If you were to test positive for gonorrhea, you will require additional treatment.  If your result for chlamydia shows a negative finding and you remain asymptomatic, you can discontinue antibiotics before completion.  Please follow-up with your primary care provider.  Return the emergency department for any concerns of new or worsening symptoms.

## 2024-04-19 LAB — GC/CHLAMYDIA PROBE AMP (~~LOC~~) NOT AT ARMC
Chlamydia: POSITIVE — AB
Comment: NEGATIVE
Comment: NORMAL
Neisseria Gonorrhea: NEGATIVE

## 2024-04-21 ENCOUNTER — Ambulatory Visit (HOSPITAL_COMMUNITY): Payer: Self-pay

## 2024-06-07 ENCOUNTER — Encounter (HOSPITAL_COMMUNITY): Payer: Self-pay

## 2024-06-07 ENCOUNTER — Emergency Department (HOSPITAL_COMMUNITY): Admission: EM | Admit: 2024-06-07 | Discharge: 2024-06-07 | Disposition: A | Discharge status: Home / Self Care

## 2024-06-07 ENCOUNTER — Emergency Department (HOSPITAL_COMMUNITY)

## 2024-06-07 ENCOUNTER — Other Ambulatory Visit: Payer: Self-pay

## 2024-06-07 DIAGNOSIS — R42 Dizziness and giddiness: Secondary | ICD-10-CM | POA: Diagnosis not present

## 2024-06-07 DIAGNOSIS — R002 Palpitations: Secondary | ICD-10-CM | POA: Diagnosis not present

## 2024-06-07 DIAGNOSIS — R079 Chest pain, unspecified: Secondary | ICD-10-CM | POA: Insufficient documentation

## 2024-06-07 LAB — BASIC METABOLIC PANEL WITH GFR
Anion gap: 9 (ref 5–15)
BUN: 13 mg/dL (ref 6–20)
CO2: 27 mmol/L (ref 22–32)
Calcium: 9.9 mg/dL (ref 8.9–10.3)
Chloride: 103 mmol/L (ref 98–111)
Creatinine, Ser: 0.85 mg/dL (ref 0.61–1.24)
GFR, Estimated: >60 mL/min (ref >60)
Glucose, Bld: 102 mg/dL — ABNORMAL HIGH (ref 70–99)
Potassium: 3.9 mmol/L (ref 3.5–5.1)
Sodium: 139 mmol/L (ref 135–145)

## 2024-06-07 LAB — TROPONIN I (HIGH SENSITIVITY): Troponin I (High Sensitivity): 3 ng/L (ref <18)

## 2024-06-07 LAB — CBC
HCT: 46 % (ref 39.0–52.0)
Hemoglobin: 14.6 g/dL (ref 13.0–17.0)
MCH: 28.6 pg (ref 26.0–34.0)
MCHC: 31.7 g/dL (ref 30.0–36.0)
MCV: 90 fL (ref 80.0–100.0)
Platelets: 357 K/uL (ref 150–400)
RBC: 5.11 MIL/uL (ref 4.22–5.81)
RDW: 12.8 % (ref 11.5–15.5)
WBC: 8.4 K/uL (ref 4.0–10.5)
nRBC: 0 % (ref 0.0–0.2)

## 2024-06-07 MED ORDER — NAPROXEN 500 MG PO TABS: 500.0000 mg | ORAL_TABLET | Freq: Two times a day (BID) | ORAL | 0 refills | Status: AC

## 2024-06-07 MED ORDER — KETOROLAC TROMETHAMINE 15 MG/ML IJ SOLN
15.0000 mg | Freq: Once | INTRAMUSCULAR | Status: AC
  Administered 2024-06-07: 15 mg via INTRAVENOUS
  Filled 2024-06-07: qty 1

## 2024-06-07 MED ORDER — LACTATED RINGERS IV BOLUS
1000.0000 mL | Freq: Once | INTRAVENOUS | Status: AC
  Administered 2024-06-07: 1000 mL via INTRAVENOUS

## 2024-06-07 MED ORDER — NAPROXEN 500 MG PO TABS: 500.0000 mg | ORAL_TABLET | Freq: Two times a day (BID) | ORAL | 0 refills | Status: DC

## 2024-06-07 NOTE — ED Notes (Signed)
 Discharge instructions reviewed with patient. Patient questions answered and opportunity for education reviewed. Patient voices understanding of discharge instructions with no further questions. Patient ambulatory with steady gait to lobby.

## 2024-06-07 NOTE — ED Provider Notes (Signed)
 [place] EMERGENCY DEPARTMENT AT [facility] Provider Note   CSN: [id number] Arrival date & time: 06/07/24  1043     Patient presents with: Chest Pain   Warren Cox is a 28 y.o. male.   28 year old male with no known past medical history who does have a loop recorder on currently presenting to the emergency department today with left-sided chest pain and palpitations as well as some lightheadedness.  The patient states that he developed this when he got to work this morning.  States he did have some symptoms yesterday as well.  He states that the pain is a dull pain over the left side of his chest.  The pain is nonpleuritic.  He denies a history of DVT or pulmonary embolism, recent surgeries, recent travels.  He came to the emergency department today due to these ongoing symptoms.  He states that he did follow-up regarding his loop recorder and is not on any medications and was told that his results have been favorable thus far.   Chest Pain      Prior to Admission medications   Medication Sig Start Date End Date Taking? Authorizing Provider  naproxen  (NAPROSYN ) 500 MG tablet Take 1 tablet (500 mg total) by mouth 2 (two) times daily. 06/07/24  Yes Ula Prentice SAUNDERS, MD  acetaminophen  (TYLENOL ) 500 MG tablet Take 2 tablets (1,000 mg total) by mouth every 8 (eight) hours as needed for moderate pain. 09/29/19   Law, Alexandra M, PA-C  albuterol (PROVENTIL HFA;VENTOLIN HFA) 108 (90 BASE) MCG/ACT inhaler Inhale 2 puffs into the lungs every 6 (six) hours as needed for wheezing or shortness of breath (use before exercise).    [provider]  ciprofloxacin  (CIPRO ) 500 MG tablet Take 1 tablet (500 mg total) by mouth 2 (two) times daily. One po bid x 7 days 12/17/19   Molpus, John, MD  ibuprofen  (ADVIL ) 600 MG tablet Take 1 tablet (600 mg total) by mouth every 6 (six) hours as needed. 09/29/19   Law, Alexandra M, PA-C    Allergies: Augmentin [amoxicillin-pot clavulanate] and  Keflex [cephalexin monohydrate]    Review of Systems  Cardiovascular:  Positive for chest pain.  All other systems reviewed and are negative.   Updated Vital Signs BP (!) 159/90 (BP Location: Right Arm)   Pulse (!) 53   Temp 98.3 F (36.8 C) (Oral)   Resp 12   Ht 5' 11 (1.803 m)   Wt 102.5 kg   SpO2 100%   BMI 31.52 kg/m   Physical Exam Vitals and nursing note reviewed.   Gen: NAD Eyes: PERRL, EOMI HEENT: no oropharyngeal swelling Neck: trachea midline Resp: clear to auscultation bilaterally, the patient does have some tenderness over the anterior chest with palpation Card: RRR, no murmurs, rubs, or gallops Abd: nontender, nondistended Extremities: no calf tenderness, no edema Vascular: 2+ radial pulses bilaterally, 2+ DP pulses bilaterally Skin: no rashes Psyc: acting appropriately   (all labs ordered are listed, but only abnormal results are displayed) Labs Reviewed  BASIC METABOLIC PANEL WITH GFR - Abnormal; Notable for the following components:      Result Value   Glucose, Bld 102 (*)    All other components within normal limits  CBC  TROPONIN I (HIGH SENSITIVITY)    EKG: None  Radiology: [facility] Chest Port 1 View Result Date: 06/07/2024 CLINICAL DATA:  Chest pain.  Lightheadedness. EXAM: PORTABLE CHEST 1 VIEW COMPARISON:  07/26/2022 FINDINGS: Stable normal sized heart and clear lungs. Stable mild  peribronchial thickening. Normal-appearing bones. IMPRESSION: No acute abnormality. Stable mild chronic bronchitic changes. Electronically Signed   By: Elspeth Bathe M.D.   On: 06/07/2024 11:44     Procedures   Medications Ordered in the ED  lactated ringers  bolus 1,000 mL (1,000 mLs Intravenous Bolus 06/07/24 1136)  ketorolac  (TORADOL ) 15 MG/ML injection 15 mg (15 mg Intravenous Given 06/07/24 1136)                                    Medical Decision Making 28 year old male with no reported past medical history presenting to the emergency department today with  chest pain.  I will further evaluate patient here with basic lab as well as an EKG, chest x-ray, and troponin for further evaluation for pericarditis, myocarditis, pulmonary infiltrates, pulmonary edema, or pneumothorax.  Will give the patient Toradol  for pain in the event this is musculoskeletal pain.  Will also give him IV fluids and try to interrogate his device.  Will keep the patient on the cardiac monitor.  I will reevaluate for ultimate disposition.  The patient is PERC negative and based on description of his symptoms suspicion for pulmonary embolism or aortic pathology is low at this time.  The patient's work appears reassuring.  Chest x-ray is unremarkable.  Initial troponin is negative and EKG is more consistent with J-point elevation and ischemia.  I think the patient is stable for discharge.  We did attempt to interrogate his device but this was going to take 24 hours apparently.  The patient remained stable here on the monitor.  I do think that he is safe for discharge.  Amount and/or Complexity of Data Reviewed Labs: ordered. Radiology: ordered.  Risk Prescription drug management.        Final diagnoses:  Chest pain, unspecified type    ED Discharge Orders          Ordered    naproxen  (NAPROSYN ) 500 MG tablet  2 times daily        06/07/24 1506               Ula Prentice SAUNDERS, MD 06/07/24 1507

## 2024-06-07 NOTE — Discharge Instructions (Signed)
 Your workup today was reassuring.  Please take the naproxen  twice daily and follow-up with your cardiologist.  Return to the emergency department for worsening symptoms.

## 2024-06-07 NOTE — ED Triage Notes (Signed)
 Pt arrived via POV with c/o lightheadedness, and chest pain. Pt had implanted portable heart monitor.

## 2024-07-22 ENCOUNTER — Other Ambulatory Visit: Payer: Self-pay

## 2024-07-22 ENCOUNTER — Emergency Department (HOSPITAL_COMMUNITY)

## 2024-07-22 ENCOUNTER — Encounter (HOSPITAL_COMMUNITY): Payer: Self-pay | Admitting: Emergency Medicine

## 2024-07-22 ENCOUNTER — Emergency Department (HOSPITAL_COMMUNITY)
Admission: EM | Admit: 2024-07-22 | Discharge: 2024-07-22 | Disposition: A | Discharge status: Home / Self Care | Attending: Emergency Medicine | Admitting: Emergency Medicine

## 2024-07-22 DIAGNOSIS — W268XXA Contact with other sharp object(s), not elsewhere classified, initial encounter: Secondary | ICD-10-CM | POA: Insufficient documentation

## 2024-07-22 DIAGNOSIS — S61012A Laceration without foreign body of left thumb without damage to nail, initial encounter: Secondary | ICD-10-CM | POA: Insufficient documentation

## 2024-07-22 DIAGNOSIS — Y93E6 Activity, residential relocation: Secondary | ICD-10-CM | POA: Diagnosis not present

## 2024-07-22 DIAGNOSIS — Z23 Encounter for immunization: Secondary | ICD-10-CM | POA: Diagnosis not present

## 2024-07-22 DIAGNOSIS — S6992XA Unspecified injury of left wrist, hand and finger(s), initial encounter: Secondary | ICD-10-CM | POA: Diagnosis present

## 2024-07-22 MED ORDER — TETANUS-DIPHTH-ACELL PERTUSSIS 5-2.5-18.5 LF-MCG/0.5 IM SUSY
0.5000 mL | PREFILLED_SYRINGE | Freq: Once | INTRAMUSCULAR | Status: AC
  Administered 2024-07-22: 0.5 mL via INTRAMUSCULAR
  Filled 2024-07-22: qty 0.5

## 2024-07-22 NOTE — Discharge Instructions (Addendum)
 You were seen in the ER today for concerns of a finger injury. Your xray did not show and signs of injury to the bone in the left thumb. This type of laceration does not require stitches to help this heal. Please keep the area clean and covered over the next week or so. Please return to the ER for any concerns of new or worsening symptoms such as concerns for infection. Follow up with your primary care provider for further evaluation.

## 2024-07-22 NOTE — ED Triage Notes (Signed)
 Pt reports cutting his left thumb while making a cooler today.

## 2024-07-22 NOTE — ED Provider Notes (Signed)
 [place] [facility] AT [facility] Provider Note   CSN: [id number] Arrival date & time: 07/22/24  1507     Patient presents with: Finger Injury   Warren Cox is a 28 y.o. male. Patient presents to the ED with concerns of a thumb laceration. He states that he was working [employer] in a cooler and he cut his finger on a new piece of metal. Denies any significant contamination or rusty metal. States that the bleeding was well controlled with direct pressure. No other area of injury that he is reporting.   HPI     Prior to Admission medications   Medication Sig Start Date End Date Taking? Authorizing Provider  acetaminophen  (TYLENOL ) 500 MG tablet Take 2 tablets (1,000 mg total) by mouth every 8 (eight) hours as needed for moderate pain. 09/29/19   Law, Alexandra M, PA-C  albuterol (PROVENTIL HFA;VENTOLIN HFA) 108 (90 BASE) MCG/ACT inhaler Inhale 2 puffs into the lungs every 6 (six) hours as needed for wheezing or shortness of breath (use before exercise).    [provider]  ciprofloxacin  (CIPRO ) 500 MG tablet Take 1 tablet (500 mg total) by mouth 2 (two) times daily. One po bid x 7 days 12/17/19   Molpus, John, MD  ibuprofen  (ADVIL ) 600 MG tablet Take 1 tablet (600 mg total) by mouth every 6 (six) hours as needed. 09/29/19   Law, Alexandra M, PA-C  naproxen  (NAPROSYN ) 500 MG tablet Take 1 tablet (500 mg total) by mouth 2 (two) times daily. 06/07/24   Ula Prentice SAUNDERS, MD    Allergies: Augmentin [amoxicillin-pot clavulanate] and Keflex [cephalexin monohydrate]    Review of Systems  Skin:  Positive for wound.  All other systems reviewed and are negative.   Updated Vital Signs BP (!) 156/79 (BP Location: Right Arm)   Pulse 84   Temp 98.9 F (37.2 C) (Oral)   Resp 16   SpO2 99%   Physical Exam Vitals and nursing note reviewed.  Constitutional:      General: He is not in acute distress.    Appearance: He is well-developed.  HENT:      Head: Normocephalic and atraumatic.  Eyes:     Conjunctiva/sclera: Conjunctivae normal.  Cardiovascular:     Rate and Rhythm: Normal rate and regular rhythm.     Heart sounds: No murmur heard. Pulmonary:     Effort: Pulmonary effort is normal. No respiratory distress.     Breath sounds: Normal breath sounds.  Abdominal:     Palpations: Abdomen is soft.     Tenderness: There is no abdominal tenderness.  Musculoskeletal:        General: No swelling.     Cervical back: Neck supple.  Skin:    General: Skin is warm and dry.     Capillary Refill: Capillary refill takes less than 2 seconds.     Findings: Lesion present.     Comments: Small 0.5 x 0.5 cm skin avulsion to the pad of the left thumb.  No active bleeding.  Not amenable to suture repair.  Neurological:     Mental Status: He is alert.  Psychiatric:        Mood and Affect: Mood normal.     (all labs ordered are listed, but only abnormal results are displayed) Labs Reviewed - No data to display  EKG: None  Radiology: DG Finger Thumb Left Result Date: 07/22/2024 CLINICAL DATA:  Laceration EXAM: LEFT THUMB 2+V COMPARISON:  None Available. FINDINGS: Frontal,  oblique, and lateral views of the left first digit are obtained. Soft tissue laceration distal aspect of the first digit. No fracture or radiopaque foreign body. Joint spaces are well preserved. Alignment is anatomic. IMPRESSION: 1. Soft tissue laceration.  No fracture or radiopaque foreign body. Electronically Signed   By: Ozell Daring M.D.   On: 07/22/2024 15:36     Procedures   Medications Ordered in the ED  Tdap (BOOSTRIX ) injection 0.5 mL (has no administration in time range)                                    Medical Decision Making Amount and/or Complexity of Data Reviewed Radiology: ordered.   This patient presents to the ED for concern of finger injury. Differential diagnosis includes skin laceration, skin avulsion, foreign body in wound    Imaging  Studies ordered:  I ordered imaging studies including x-ray left thumb I independently visualized and interpreted imaging which showed negative for any acute findings, skin laceration I agree with the radiologist interpretation   Medicines ordered and prescription drug management:  I ordered medication including Tdap for wound prophylaxis Reevaluation of the patient after these medicines showed that the patient stayed the same I have reviewed the patients home medicines and have made adjustments as needed   Problem List / ED Course:  Patient presents to the emergency department concerns of a finger laceration.  He cut his left thumb while he was assembling a cooler on a metal shelf.  He states that the shelf was a new piece of metal that was clean.  Unsure of last Tdap.  States bleeding was well-controlled prior to arriving.  Not on blood thinners. On exam, patient has an avulsion type lesion to the pad of the left thumb.  Not actively bleeding.  X-ray ordered from triage for further evaluation. X-ray negative for any fracture or dislocation no signs of any foreign body.  Soft tissue injury noted. Based on orientation of the lesion to the left thumb, do not feel that this lesion is amenable to suture repair or wound closure.  Will allow healing by secondary intention.  Wound cleaned and dressed with nonadherent dressing.  Tdap updated. Advise close follow-up with PCP.  Return precautions to the emergency department discussed.  Otherwise stable at this time for outpatient follow-up and discharge home.   Social Determinants of Health:  None  Final diagnoses:  Laceration of left thumb without foreign body without damage to nail, initial encounter    ED Discharge Orders     None          Cecily Legrand LABOR, PA-C 07/22/24 1649    Emil Share, DO 07/22/24 1650
